# Patient Record
Sex: Male | Born: 1970 | Race: Black or African American | Hispanic: No | Marital: Single | Smoking: Never smoker
Health system: Southern US, Community
[De-identification: ages and names within clinical notes are randomized; demographics above are authoritative.]

## PROBLEM LIST (undated history)

## (undated) DIAGNOSIS — I10 Essential (primary) hypertension: Secondary | ICD-10-CM

## (undated) HISTORY — DX: Essential (primary) hypertension: I10

---

## 2004-10-06 ENCOUNTER — Emergency Department: Payer: Self-pay | Admitting: Unknown Physician Specialty

## 2010-04-03 HISTORY — PX: TONSILLECTOMY AND ADENOIDECTOMY: SHX28

## 2010-04-18 ENCOUNTER — Other Ambulatory Visit: Payer: Self-pay | Admitting: Family Medicine

## 2011-06-26 ENCOUNTER — Other Ambulatory Visit: Payer: Self-pay | Admitting: Family Medicine

## 2012-12-30 ENCOUNTER — Other Ambulatory Visit: Payer: Self-pay | Admitting: Family Medicine

## 2012-12-30 LAB — LIPID PANEL
Cholesterol: 158 mg/dL (ref 0–200)
HDL Cholesterol: 60 mg/dL (ref 40–60)
Ldl Cholesterol, Calc: 87 mg/dL (ref 0–100)
Triglycerides: 55 mg/dL (ref 0–200)
VLDL Cholesterol, Calc: 11 mg/dL (ref 5–40)

## 2012-12-30 LAB — TSH: Thyroid Stimulating Horm: 0.701 u[IU]/mL

## 2012-12-31 LAB — PSA: PSA: 0.8 ng/mL (ref 0.0–4.0)

## 2013-07-15 ENCOUNTER — Other Ambulatory Visit: Payer: Self-pay | Admitting: Family Medicine

## 2013-07-15 LAB — COMPREHENSIVE METABOLIC PANEL
Albumin: 3.8 g/dL (ref 3.4–5.0)
Alkaline Phosphatase: 95 U/L (ref 50–136)
Anion Gap: 2 — ABNORMAL LOW (ref 7–16)
BUN: 17 mg/dL (ref 7–18)
Bilirubin,Total: 0.3 mg/dL (ref 0.2–1.0)
Calcium, Total: 9.2 mg/dL (ref 8.5–10.1)
Chloride: 108 mmol/L — ABNORMAL HIGH (ref 98–107)
Co2: 30 mmol/L (ref 21–32)
Creatinine: 1 mg/dL (ref 0.60–1.30)
EGFR (African American): >60
EGFR (Non-African Amer.): >60
Glucose: 72 mg/dL (ref 65–99)
Osmolality: 279 (ref 275–301)
Potassium: 4.5 mmol/L (ref 3.5–5.1)
SGOT(AST): 25 U/L (ref 15–37)
SGPT (ALT): 25 U/L (ref 12–78)
Sodium: 140 mmol/L (ref 136–145)
Total Protein: 7.2 g/dL (ref 6.4–8.2)

## 2013-07-15 LAB — LIPID PANEL
Cholesterol: 148 mg/dL (ref 0–200)
HDL Cholesterol: 64 mg/dL — ABNORMAL HIGH (ref 40–60)
Ldl Cholesterol, Calc: 63 mg/dL (ref 0–100)
Triglycerides: 107 mg/dL (ref 0–200)
VLDL Cholesterol, Calc: 21 mg/dL (ref 5–40)

## 2013-07-15 LAB — CBC WITH DIFFERENTIAL/PLATELET
Basophil #: 0 10*3/uL (ref 0.0–0.1)
Basophil %: 0.7 %
Eosinophil #: 0.1 10*3/uL (ref 0.0–0.7)
Eosinophil %: 1.6 %
HCT: 42.9 % (ref 40.0–52.0)
HGB: 14.6 g/dL (ref 13.0–18.0)
Lymphocyte #: 0.8 10*3/uL — ABNORMAL LOW (ref 1.0–3.6)
Lymphocyte %: 13.5 %
MCH: 28.8 pg (ref 26.0–34.0)
MCHC: 34.1 g/dL (ref 32.0–36.0)
MCV: 84 fL (ref 80–100)
Monocyte #: 0.5 x10 3/mm (ref 0.2–1.0)
Monocyte %: 9 %
Neutrophil #: 4.4 10*3/uL (ref 1.4–6.5)
Neutrophil %: 75.2 %
Platelet: 221 10*3/uL (ref 150–440)
RBC: 5.08 10*6/uL (ref 4.40–5.90)
RDW: 14.5 % (ref 11.5–14.5)
WBC: 5.9 10*3/uL (ref 3.8–10.6)

## 2013-07-16 LAB — PSA: PSA: 1.2 ng/mL (ref 0.0–4.0)

## 2014-07-18 LAB — LIPID PANEL
Cholesterol: 162 mg/dL (ref 0–200)
HDL: 59 mg/dL (ref 35–70)
LDL Cholesterol: 91 mg/dL
Triglycerides: 59 mg/dL (ref 40–160)

## 2014-07-18 LAB — PSA: PSA: 1.1

## 2014-08-09 ENCOUNTER — Ambulatory Visit: Payer: Self-pay | Admitting: Family Medicine

## 2014-08-09 LAB — CBC WITH DIFFERENTIAL/PLATELET
Basophil #: 0.1 10*3/uL (ref 0.0–0.1)
Basophil %: 0.8 %
Eosinophil #: 0 10*3/uL (ref 0.0–0.7)
Eosinophil %: 0.7 %
HCT: 47.2 % (ref 40.0–52.0)
HGB: 15.3 g/dL (ref 13.0–18.0)
Lymphocyte #: 1.3 10*3/uL (ref 1.0–3.6)
Lymphocyte %: 18.4 %
MCH: 27.9 pg (ref 26.0–34.0)
MCHC: 32.4 g/dL (ref 32.0–36.0)
MCV: 86 fL (ref 80–100)
Monocyte #: 0.4 x10 3/mm (ref 0.2–1.0)
Monocyte %: 5.9 %
Neutrophil #: 5.1 10*3/uL (ref 1.4–6.5)
Neutrophil %: 74.2 %
Platelet: 243 10*3/uL (ref 150–440)
RBC: 5.47 10*6/uL (ref 4.40–5.90)
RDW: 14.2 % (ref 11.5–14.5)
WBC: 6.8 10*3/uL (ref 3.8–10.6)

## 2014-08-09 LAB — COMPREHENSIVE METABOLIC PANEL
Albumin: 4.1 g/dL (ref 3.4–5.0)
Alkaline Phosphatase: 89 U/L
Anion Gap: 8 (ref 7–16)
BUN: 20 mg/dL — ABNORMAL HIGH (ref 7–18)
Bilirubin,Total: 0.4 mg/dL (ref 0.2–1.0)
Calcium, Total: 8.9 mg/dL (ref 8.5–10.1)
Chloride: 105 mmol/L (ref 98–107)
Co2: 26 mmol/L (ref 21–32)
Creatinine: 1.05 mg/dL (ref 0.60–1.30)
EGFR (African American): >60
EGFR (Non-African Amer.): >60
Glucose: 87 mg/dL (ref 65–99)
Osmolality: 280 (ref 275–301)
Potassium: 3.6 mmol/L (ref 3.5–5.1)
SGOT(AST): 18 U/L (ref 15–37)
SGPT (ALT): 24 U/L
Sodium: 139 mmol/L (ref 136–145)
Total Protein: 7.5 g/dL (ref 6.4–8.2)

## 2014-08-09 LAB — LIPID PANEL
Cholesterol: 162 mg/dL (ref 0–200)
HDL Cholesterol: 59 mg/dL (ref 40–60)
Ldl Cholesterol, Calc: 91 mg/dL (ref 0–100)
Triglycerides: 59 mg/dL (ref 0–200)
VLDL Cholesterol, Calc: 12 mg/dL (ref 5–40)

## 2014-12-28 DIAGNOSIS — I10 Essential (primary) hypertension: Secondary | ICD-10-CM | POA: Insufficient documentation

## 2015-02-19 ENCOUNTER — Ambulatory Visit: Payer: Self-pay | Admitting: Family Medicine

## 2015-02-19 ENCOUNTER — Encounter: Payer: Self-pay | Admitting: Family Medicine

## 2015-02-19 DIAGNOSIS — E669 Obesity, unspecified: Secondary | ICD-10-CM | POA: Insufficient documentation

## 2015-02-19 DIAGNOSIS — F419 Anxiety disorder, unspecified: Secondary | ICD-10-CM | POA: Insufficient documentation

## 2015-02-19 DIAGNOSIS — G4733 Obstructive sleep apnea (adult) (pediatric): Secondary | ICD-10-CM | POA: Insufficient documentation

## 2015-02-19 DIAGNOSIS — J309 Allergic rhinitis, unspecified: Secondary | ICD-10-CM | POA: Insufficient documentation

## 2015-02-19 DIAGNOSIS — E739 Lactose intolerance, unspecified: Secondary | ICD-10-CM | POA: Insufficient documentation

## 2015-03-14 ENCOUNTER — Other Ambulatory Visit: Payer: Self-pay | Admitting: Family Medicine

## 2015-04-03 ENCOUNTER — Encounter: Payer: Self-pay | Admitting: Family Medicine

## 2015-04-03 ENCOUNTER — Ambulatory Visit (INDEPENDENT_AMBULATORY_CARE_PROVIDER_SITE_OTHER): Payer: BLUE CROSS/BLUE SHIELD | Admitting: Family Medicine

## 2015-04-03 ENCOUNTER — Other Ambulatory Visit
Admission: RE | Admit: 2015-04-03 | Discharge: 2015-04-03 | Disposition: A | Discharge status: Home / Self Care | Payer: BLUE CROSS/BLUE SHIELD | Source: Ambulatory Visit | Attending: Family Medicine | Admitting: Family Medicine

## 2015-04-03 VITALS — BP 124/68 | HR 65 | Temp 97.8°F | Resp 16 | Ht 65.0 in | Wt 175.1 lb

## 2015-04-03 DIAGNOSIS — J301 Allergic rhinitis due to pollen: Secondary | ICD-10-CM

## 2015-04-03 DIAGNOSIS — G4733 Obstructive sleep apnea (adult) (pediatric): Secondary | ICD-10-CM | POA: Diagnosis not present

## 2015-04-03 DIAGNOSIS — I10 Essential (primary) hypertension: Secondary | ICD-10-CM

## 2015-04-03 LAB — COMPREHENSIVE METABOLIC PANEL
ALT: 17 U/L (ref 17–63)
AST: 18 U/L (ref 15–41)
Albumin: 4.2 g/dL (ref 3.5–5.0)
Alkaline Phosphatase: 84 U/L (ref 38–126)
Anion gap: 7 (ref 5–15)
BUN: 15 mg/dL (ref 6–20)
CO2: 25 mmol/L (ref 22–32)
Calcium: 9.2 mg/dL (ref 8.9–10.3)
Chloride: 107 mmol/L (ref 101–111)
Creatinine, Ser: 0.92 mg/dL (ref 0.61–1.24)
GFR calc Af Amer: >60 mL/min (ref >60)
GFR calc non Af Amer: >60 mL/min (ref >60)
Glucose, Bld: 100 mg/dL — ABNORMAL HIGH (ref 65–99)
Potassium: 3.7 mmol/L (ref 3.5–5.1)
Sodium: 139 mmol/L (ref 135–145)
Total Bilirubin: 0.4 mg/dL (ref 0.3–1.2)
Total Protein: 7.2 g/dL (ref 6.5–8.1)

## 2015-04-03 MED ORDER — MOMETASONE FUROATE 50 MCG/ACT NA SUSP: 2.0000 | Freq: Every day | NASAL | Status: DC

## 2015-04-03 NOTE — Progress Notes (Signed)
Name: EBIN PALAZZI   MRN: [id number]    DOB: 1971/02/14   Date:04/03/2015       Progress Note  Subjective  Chief Complaint  Chief Complaint  Patient presents with  . Hypertension  . Gastrophageal Reflux    Hypertension This is a chronic problem. The current episode started more than 1 year ago. The problem is unchanged. The problem is controlled. Pertinent negatives include no blurred vision, chest pain, headaches, neck pain, orthopnea, palpitations or shortness of breath. There are no associated agents to hypertension. Risk factors for coronary artery disease include male gender and sedentary lifestyle. Past treatments include ACE inhibitors and diuretics. The current treatment provides moderate improvement. There are no compliance problems.   Gastrophageal Reflux He complains of heartburn. He reports no chest pain, no coughing, no nausea or no sore throat. This is a chronic problem. The current episode started more than 1 year ago. The problem occurs frequently. The problem has been unchanged. The symptoms are aggravated by caffeine, lying down and certain foods. Pertinent negatives include no weight loss. Risk factors include caffeine use and obesity. He has tried a PPI for the symptoms. The treatment provided mild relief. Past procedures include an EGD.   Allergic rhinitis,  Complaint of bilateral nasal congestion and drainage which is copious and often watery particularly at night. There is no redness or irritation of the eyes or throat or  ears and there is no cough. Nasal discharge is clear in color.   Past Medical History  Diagnosis Date  . Hypertension     History  Substance Use Topics  . Smoking status: Never Smoker   . Smokeless tobacco: Not on file  . Alcohol Use: No     Current outpatient prescriptions:  .  benzonatate (TESSALON) 100 MG capsule, , Disp: , Rfl: 0 .  lisinopril-hydrochlorothiazide (PRINZIDE,ZESTORETIC) 20-12.5 MG per tablet, Take 1 tablet by mouth  daily., Disp: , Rfl:  .  omeprazole (PRILOSEC) 20 MG capsule, TAKE ONE CAPSULE BY MOUTH EVERY DAY, Disp: 90 capsule, Rfl: 3  No Known Allergies  Review of Systems  Constitutional: Negative for fever, chills and weight loss.  HENT: Positive for congestion. Negative for hearing loss, sore throat and tinnitus.        Nocturnal  nasal drainage and sneezing and itching  Eyes: Negative for blurred vision, double vision and redness.  Respiratory: Negative for cough, hemoptysis and shortness of breath.   Cardiovascular: Negative for chest pain, palpitations, orthopnea, claudication and leg swelling.  Gastrointestinal: Positive for heartburn. Negative for nausea, vomiting, diarrhea, constipation and blood in stool.  Genitourinary: Negative for dysuria, urgency, frequency and hematuria.  Musculoskeletal: Negative for myalgias, back pain, joint pain, falls and neck pain.  Skin: Negative for itching.  Neurological: Negative for dizziness, tingling, tremors, focal weakness, seizures, loss of consciousness, weakness and headaches.  Endo/Heme/Allergies: Does not bruise/bleed easily.  Psychiatric/Behavioral: Negative for depression and substance abuse. The patient is not nervous/anxious and does not have insomnia.      Objective  Filed Vitals:   04/03/15 1427  BP: 124/68  Pulse: 65  Temp: 97.8 F (36.6 C)  Resp: 16  Height:  (1.651 m)  Weight: 175 lb 1 oz (79.408 kg)  SpO2: 97%     Physical Exam  Constitutional: He is oriented to person, place, and time and well-developed, well-nourished, and in no distress.  HENT:  Head: Normocephalic.  Bilateral nasal turbinate swelling and erythema  Eyes: EOM are normal. Pupils are equal,  round, and reactive to light.  Neck: Normal range of motion. Neck supple. No thyromegaly present.  Cardiovascular: Normal rate, regular rhythm and normal heart sounds.   No murmur heard. Pulmonary/Chest: Effort normal and breath sounds normal. No respiratory  distress. He has no wheezes.  Musculoskeletal: Normal range of motion. He exhibits no edema.  Lymphadenopathy:    He has no cervical adenopathy.  Neurological: He is alert and oriented to person, place, and time. No cranial nerve deficit. Gait normal. Coordination normal.  Skin: Skin is warm and dry. No rash noted.  Psychiatric: Affect and judgment normal.      Assessment & Plan  1. Allergic rhinitis due to pollen Flonase OTC  2. Obstructive apnea Uses CPAP  3. Essential hypertension Well-controlled - Comprehensive metabolic panel

## 2015-04-03 NOTE — Patient Instructions (Addendum)
Follow-up in 6 months Watch his bed clothing in hot water to reduce dust mites

## 2015-07-24 ENCOUNTER — Encounter: Payer: Self-pay | Admitting: Family Medicine

## 2015-08-06 ENCOUNTER — Encounter: Payer: Self-pay | Admitting: Family Medicine

## 2015-08-06 ENCOUNTER — Ambulatory Visit (INDEPENDENT_AMBULATORY_CARE_PROVIDER_SITE_OTHER): Payer: BLUE CROSS/BLUE SHIELD | Admitting: Family Medicine

## 2015-08-06 ENCOUNTER — Other Ambulatory Visit
Admission: RE | Admit: 2015-08-06 | Discharge: 2015-08-06 | Disposition: A | Discharge status: Home / Self Care | Payer: BLUE CROSS/BLUE SHIELD | Source: Ambulatory Visit | Attending: Family Medicine | Admitting: Family Medicine

## 2015-08-06 VITALS — BP 138/80 | HR 69 | Temp 98.9°F | Resp 18 | Ht 65.0 in | Wt 175.5 lb

## 2015-08-06 DIAGNOSIS — Z125 Encounter for screening for malignant neoplasm of prostate: Secondary | ICD-10-CM

## 2015-08-06 DIAGNOSIS — Z0000 Encounter for general adult medical examination without abnormal findings: Secondary | ICD-10-CM | POA: Diagnosis not present

## 2015-08-06 LAB — COMPREHENSIVE METABOLIC PANEL
ALT: 22 U/L (ref 17–63)
AST: 17 U/L (ref 15–41)
Albumin: 4.5 g/dL (ref 3.5–5.0)
Alkaline Phosphatase: 82 U/L (ref 38–126)
Anion gap: 5 (ref 5–15)
BUN: 16 mg/dL (ref 6–20)
CO2: 27 mmol/L (ref 22–32)
Calcium: 9.8 mg/dL (ref 8.9–10.3)
Chloride: 108 mmol/L (ref 101–111)
Creatinine, Ser: 0.96 mg/dL (ref 0.61–1.24)
GFR calc Af Amer: >60 mL/min (ref >60)
GFR calc non Af Amer: >60 mL/min (ref >60)
Glucose, Bld: 97 mg/dL (ref 65–99)
Potassium: 4.3 mmol/L (ref 3.5–5.1)
Sodium: 140 mmol/L (ref 135–145)
Total Bilirubin: 0.3 mg/dL (ref 0.3–1.2)
Total Protein: 7.6 g/dL (ref 6.5–8.1)

## 2015-08-06 LAB — TSH: TSH: 1.708 u[IU]/mL (ref 0.350–4.500)

## 2015-08-06 LAB — CBC WITH DIFFERENTIAL/PLATELET
Basophils Absolute: 0.1 10*3/uL (ref 0–0.1)
Basophils Relative: 1 %
Eosinophils Absolute: 0.1 10*3/uL (ref 0–0.7)
Eosinophils Relative: 1 %
HCT: 47.8 % (ref 40.0–52.0)
Hemoglobin: 15.7 g/dL (ref 13.0–18.0)
Lymphocytes Relative: 21 %
Lymphs Abs: 1.3 10*3/uL (ref 1.0–3.6)
MCH: 28 pg (ref 26.0–34.0)
MCHC: 32.8 g/dL (ref 32.0–36.0)
MCV: 85.5 fL (ref 80.0–100.0)
Monocytes Absolute: 0.4 10*3/uL (ref 0.2–1.0)
Monocytes Relative: 7 %
Neutro Abs: 4.6 10*3/uL (ref 1.4–6.5)
Neutrophils Relative %: 70 %
Platelets: 240 10*3/uL (ref 150–440)
RBC: 5.6 MIL/uL (ref 4.40–5.90)
RDW: 14.3 % (ref 11.5–14.5)
WBC: 6.5 10*3/uL (ref 3.8–10.6)

## 2015-08-06 LAB — LIPID PANEL
Cholesterol: 182 mg/dL (ref 0–200)
HDL: 58 mg/dL (ref >40)
LDL Cholesterol: 108 mg/dL — ABNORMAL HIGH (ref 0–99)
Total CHOL/HDL Ratio: 3.1 RATIO
Triglycerides: 78 mg/dL (ref <150)
VLDL: 16 mg/dL (ref 0–40)

## 2015-08-06 LAB — PSA: PSA: 1.23 ng/mL (ref 0.00–4.00)

## 2015-08-06 MED ORDER — LISINOPRIL-HYDROCHLOROTHIAZIDE 20-12.5 MG PO TABS: 1.0000 | ORAL_TABLET | Freq: Every day | ORAL | Status: DC

## 2015-08-06 NOTE — Progress Notes (Signed)
Name: Alan Mathews   MRN: [id number]    DOB: 1971-07-11   Date:08/06/2015       Progress Note  Subjective  Chief Complaint  Chief Complaint  Patient presents with  . Annual Exam    HPI  44 year old presents for comprehensive H&P at the age of 44. Baseline problems are stable  Past Medical History  Diagnosis Date  . Hypertension     Social History  Substance Use Topics  . Smoking status: Never Smoker   . Smokeless tobacco: Not on file  . Alcohol Use: No     Current outpatient prescriptions:  .  lisinopril-hydrochlorothiazide (PRINZIDE,ZESTORETIC) 20-12.5 MG per tablet, Take 1 tablet by mouth daily., Disp: , Rfl:  .  mometasone (NASONEX) 50 MCG/ACT nasal spray, Place 2 sprays into the nose daily., Disp: 17 g, Rfl: 12 .  omeprazole (PRILOSEC) 20 MG capsule, TAKE ONE CAPSULE BY MOUTH EVERY DAY, Disp: 90 capsule, Rfl: 3 .  benzonatate (TESSALON) 100 MG capsule, , Disp: , Rfl: 0  No Known Allergies  Review of Systems  Constitutional: Negative for fever, chills and weight loss.  HENT: Negative for congestion, hearing loss, sore throat and tinnitus.   Eyes: Negative for blurred vision, double vision and redness.  Respiratory: Negative for cough, hemoptysis and shortness of breath.   Cardiovascular: Negative for chest pain, palpitations, orthopnea, claudication and leg swelling.  Gastrointestinal: Negative for heartburn, nausea, vomiting, diarrhea, constipation and blood in stool.  Genitourinary: Negative for dysuria, urgency, frequency and hematuria.  Musculoskeletal: Negative for myalgias, back pain, joint pain, falls and neck pain.  Skin: Negative for itching.  Neurological: Negative for dizziness, tingling, tremors, focal weakness, seizures, loss of consciousness, weakness and headaches.  Endo/Heme/Allergies: Does not bruise/bleed easily.  Psychiatric/Behavioral: Negative for depression and substance abuse. The patient is not nervous/anxious and does not have insomnia.       Objective  Filed Vitals:   08/06/15 1134  BP: 138/80  Pulse: 69  Temp: 98.9 F (37.2 C)  Resp: 18  Height: 5\' 5"  (1.651 m)  Weight: 175 lb 8 oz (79.606 kg)  SpO2: 98%     Physical Exam  Constitutional: He is oriented to person, place, and time and well-developed, well-nourished, and in no distress.  HENT:  Head: Normocephalic.  Eyes: EOM are normal. Pupils are equal, round, and reactive to light.  Neck: Normal range of motion. Neck supple. No thyromegaly present.  Cardiovascular: Normal rate, regular rhythm and normal heart sounds.   No murmur heard. Pulmonary/Chest: Effort normal and breath sounds normal. No respiratory distress. He has no wheezes.  Abdominal: Soft. Bowel sounds are normal.  Genitourinary: Rectum normal, prostate normal and penis normal. Guaiac negative stool. No discharge found.  Musculoskeletal: Normal range of motion. He exhibits no edema.  Lymphadenopathy:    He has no cervical adenopathy.  Neurological: He is alert and oriented to person, place, and time. No cranial nerve deficit. Gait normal. Coordination normal.  Skin: Skin is warm and dry. No rash noted.  Psychiatric: Affect and judgment normal.      Assessment & Plan  1. Annual physical exam  - CBC - Comprehensive Metabolic Panel (CMET) - Lipid Profile - TSH  2. Prostate cancer screening  - PSA

## 2015-10-04 ENCOUNTER — Ambulatory Visit: Payer: BLUE CROSS/BLUE SHIELD | Admitting: Family Medicine

## 2015-12-05 ENCOUNTER — Ambulatory Visit: Payer: BLUE CROSS/BLUE SHIELD | Admitting: Family Medicine

## 2015-12-11 ENCOUNTER — Encounter: Payer: Self-pay | Admitting: Family Medicine

## 2015-12-11 ENCOUNTER — Ambulatory Visit (INDEPENDENT_AMBULATORY_CARE_PROVIDER_SITE_OTHER): Payer: BLUE CROSS/BLUE SHIELD | Admitting: Family Medicine

## 2015-12-11 VITALS — BP 137/76 | HR 78 | Temp 98.4°F | Resp 16 | Ht 65.0 in | Wt 171.3 lb

## 2015-12-11 DIAGNOSIS — R058 Other specified cough: Secondary | ICD-10-CM | POA: Insufficient documentation

## 2015-12-11 DIAGNOSIS — R05 Cough: Secondary | ICD-10-CM

## 2015-12-11 MED ORDER — BENZONATATE 200 MG PO CAPS: 200.0000 mg | ORAL_CAPSULE | Freq: Two times a day (BID) | ORAL | Status: DC | PRN

## 2015-12-11 MED ORDER — AZITHROMYCIN 250 MG PO TABS: ORAL_TABLET | ORAL | Status: DC

## 2015-12-11 NOTE — Progress Notes (Signed)
Name: Alan Mathews   MRN: [id number]    DOB: April 23, 1971   Date:12/11/2015       Progress Note  Subjective  Chief Complaint  Chief Complaint  Patient presents with  . Acute Visit    Congestion/cough and runny nose (Dr. Thana Ates pt) x1 wk he has been taking nyquil for symptoms last time taken was saturday night  12/08/2015    HPI  Cough and Congestion: Symptoms onset 7 days ago, started with dry cough, runny nose, then started having body aches. He has taken Nyquil for symptoms, which has helped to some extent.  Past Medical History  Diagnosis Date  . Hypertension     Past Surgical History  Procedure Laterality Date  . Tonsillectomy and adenoidectomy  04/03/2010    Family History  Problem Relation Age of Onset  . Hypertension Mother   . Hypertension Father     Social History   Social History  . Marital Status: Single    Spouse Name: N/A  . Number of Children: N/A  . Years of Education: N/A   Occupational History  . Not on file.   Social History Main Topics  . Smoking status: Never Smoker   . Smokeless tobacco: Not on file  . Alcohol Use: No  . Drug Use: No  . Sexual Activity: Not on file   Other Topics Concern  . Not on file   Social History Narrative     Current outpatient prescriptions:  .  lisinopril-hydrochlorothiazide (PRINZIDE,ZESTORETIC) 20-12.5 MG tablet, Take 1 tablet by mouth daily., Disp: 90 tablet, Rfl: 1 .  mometasone (NASONEX) 50 MCG/ACT nasal spray, Place 2 sprays into the nose daily., Disp: 17 g, Rfl: 12 .  omeprazole (PRILOSEC) 20 MG capsule, TAKE ONE CAPSULE BY MOUTH EVERY DAY, Disp: 90 capsule, Rfl: 3  No Known Allergies   Review of Systems  Constitutional: Negative for fever and chills (last week had chills with onset of symptoms.).  HENT: Negative for sore throat.   Respiratory: Positive for cough and sputum production. Negative for shortness of breath and wheezing.     Objective  Filed Vitals:   12/11/15 1002  BP: 137/76   Pulse: 78  Temp: 98.4 F (36.9 C)  TempSrc: Oral  Resp: 16  Height:  (1.651 m)  Weight: 171 lb 4.8 oz (77.701 kg)  SpO2: 97%    Physical Exam  Constitutional: He is oriented to person, place, and time and well-developed, well-nourished, and in no distress.  HENT:  Head: Normocephalic and atraumatic.  Nose: Right sinus exhibits no maxillary sinus tenderness and no frontal sinus tenderness. Left sinus exhibits no maxillary sinus tenderness and no frontal sinus tenderness.  Mouth/Throat: Posterior oropharyngeal erythema present. No posterior oropharyngeal edema.  R. Ear cerumen impaction. L. Ear normal canal.  Cardiovascular: Normal rate and regular rhythm.   Pulmonary/Chest: Effort normal and breath sounds normal.  Neurological: He is alert and oriented to person, place, and time.  Nursing note and vitals reviewed.      Assessment & Plan  1. Productive cough Symptoms are resolving, started on Tessalon for relief of coughing. May start taking antibiotic therapy if symptoms do not completely resolve within the next 48 hours. - benzonatate (TESSALON) 200 MG capsule; Take 1 capsule (200 mg total) by mouth 2 (two) times daily as needed for cough.  Dispense: 20 capsule; Refill: 0 - azithromycin (ZITHROMAX Z-PAK) 250 MG tablet; 2 tabs po day 1, then 1 tab po q day x 4 days  Dispense: 6 each; Refill: 0   Lanisha Stepanian Asad A. Faylene KurtzShah [facility] 12/11/2015 10:24 AM

## 2016-01-09 ENCOUNTER — Ambulatory Visit: Payer: BLUE CROSS/BLUE SHIELD | Admitting: Family Medicine

## 2016-04-30 ENCOUNTER — Ambulatory Visit: Payer: BLUE CROSS/BLUE SHIELD | Admitting: Family Medicine

## 2016-05-07 DIAGNOSIS — H16143 Punctate keratitis, bilateral: Secondary | ICD-10-CM | POA: Diagnosis not present

## 2016-05-07 DIAGNOSIS — H04123 Dry eye syndrome of bilateral lacrimal glands: Secondary | ICD-10-CM | POA: Diagnosis not present

## 2016-05-27 ENCOUNTER — Other Ambulatory Visit: Payer: Self-pay | Admitting: Family Medicine

## 2016-06-20 ENCOUNTER — Telehealth: Payer: Self-pay | Admitting: Family Medicine

## 2016-06-20 NOTE — Telephone Encounter (Signed)
Requesting refill on omeprazole. Please send to [facility]. Patient is completely out

## 2016-06-20 NOTE — Telephone Encounter (Signed)
Please schedule patient for an appointment for medication refills. 

## 2016-06-20 NOTE — Telephone Encounter (Signed)
Left voice message for patient to schedule appointment. °

## 2016-06-30 ENCOUNTER — Encounter: Payer: Self-pay | Admitting: Family Medicine

## 2016-06-30 ENCOUNTER — Ambulatory Visit (INDEPENDENT_AMBULATORY_CARE_PROVIDER_SITE_OTHER): Payer: BLUE CROSS/BLUE SHIELD | Admitting: Family Medicine

## 2016-06-30 VITALS — BP 140/80 | HR 72 | Temp 98.5°F | Resp 16 | Ht 66.0 in | Wt 172.0 lb

## 2016-06-30 DIAGNOSIS — K219 Gastro-esophageal reflux disease without esophagitis: Secondary | ICD-10-CM | POA: Insufficient documentation

## 2016-06-30 DIAGNOSIS — I10 Essential (primary) hypertension: Secondary | ICD-10-CM

## 2016-06-30 MED ORDER — OMEPRAZOLE 20 MG PO CPDR: 20.0000 mg | DELAYED_RELEASE_CAPSULE | Freq: Every day | ORAL | 1 refills | Status: DC

## 2016-06-30 NOTE — Progress Notes (Signed)
Name: Alan HeraldKevin J Mathews   MRN: [id number]    DOB: 02-Aug-1971   Date:06/30/2016       Progress Note  Subjective  Chief Complaint  Chief Complaint  Patient presents with  . Hypertension  . Gastroesophageal Reflux    medication refilla    Hypertension  This is a chronic problem. The problem is unchanged. The problem is controlled. Pertinent negatives include no blurred vision, chest pain, headaches, palpitations or shortness of breath. Past treatments include ACE inhibitors and diuretics. There is no history of kidney disease, CAD/MI or CVA.  Gastroesophageal Reflux  He complains of abdominal pain, belching and heartburn. He reports no chest pain, no choking, no coughing or no dysphagia. This is a chronic problem. The problem has been unchanged. The heartburn is located in the abdomen. The symptoms are aggravated by certain foods (bananas, coffee, sodas). He has tried a PPI for the symptoms. Past procedures do not include an EGD or esophageal manometry.     Past Medical History:  Diagnosis Date  . Hypertension     Past Surgical History:  Procedure Laterality Date  . TONSILLECTOMY AND ADENOIDECTOMY  04/03/2010    Family History  Problem Relation Age of Onset  . Hypertension Mother   . Hypertension Father     Social History   Social History  . Marital status: Single    Spouse name: N/A  . Number of children: N/A  . Years of education: N/A   Occupational History  . Not on file.   Social History Main Topics  . Smoking status: Never Smoker  . Smokeless tobacco: Not on file  . Alcohol use No  . Drug use: No  . Sexual activity: Not on file   Other Topics Concern  . Not on file   Social History Narrative  . No narrative on file     Current Outpatient Prescriptions:  .  lisinopril-hydrochlorothiazide (PRINZIDE,ZESTORETIC) 20-12.5 MG tablet, Take 1 tablet by mouth daily., Disp: 90 tablet, Rfl: 1 .  omeprazole (PRILOSEC) 20 MG capsule, TAKE ONE CAPSULE BY MOUTH EVERY  DAY, Disp: 90 capsule, Rfl: 3  No Known Allergies   Review of Systems  Eyes: Negative for blurred vision.  Respiratory: Negative for cough, choking and shortness of breath.   Cardiovascular: Negative for chest pain and palpitations.  Gastrointestinal: Positive for abdominal pain and heartburn. Negative for dysphagia.  Neurological: Negative for headaches.    Objective  Vitals:   06/30/16 1028  BP: 140/80  Pulse: 72  Resp: 16  Temp: 98.5 F (36.9 C)  TempSrc: Oral  SpO2: 98%  Weight: 172 lb (78 kg)  Height: 5\' 6"  (1.676 m)    Physical Exam  Constitutional: He is oriented to person, place, and time and well-developed, well-nourished, and in no distress.  HENT:  Head: Normocephalic.  Cardiovascular: Normal rate, regular rhythm and normal heart sounds.   No murmur heard. Pulmonary/Chest: Effort normal. He has wheezes.  Abdominal: Soft. Bowel sounds are normal. There is no tenderness.  Musculoskeletal: He exhibits no edema.  Neurological: He is alert and oriented to person, place, and time.  Skin: Skin is warm and dry.  Psychiatric: Mood, memory, affect and judgment normal.  Nursing note and vitals reviewed.   Assessment & Plan  1. Essential hypertension BP stable and controlled on present antihypertensive therapy  2. Gastroesophageal reflux disease, esophagitis presence not specified Stable and responsive to PPI.  - omeprazole (PRILOSEC) 20 MG capsule; Take 1 capsule (20 mg total) by mouth daily.  Dispense: 90 capsule; Refill: 1   Maudene Stotler Asad A. Faylene Kurtz [facility] 06/30/2016 10:34 AM

## 2016-07-28 ENCOUNTER — Other Ambulatory Visit: Payer: Self-pay | Admitting: Family Medicine

## 2016-08-08 ENCOUNTER — Encounter: Payer: Self-pay | Admitting: Family Medicine

## 2016-08-08 ENCOUNTER — Ambulatory Visit (INDEPENDENT_AMBULATORY_CARE_PROVIDER_SITE_OTHER): Payer: BLUE CROSS/BLUE SHIELD | Admitting: Family Medicine

## 2016-08-08 DIAGNOSIS — Z0000 Encounter for general adult medical examination without abnormal findings: Secondary | ICD-10-CM | POA: Diagnosis not present

## 2016-08-08 LAB — CBC WITH DIFFERENTIAL/PLATELET
Basophils Absolute: 0 cells/uL (ref 0–200)
Basophils Relative: 0 %
Eosinophils Absolute: 106 cells/uL (ref 15–500)
Eosinophils Relative: 2 %
HCT: 48.2 % (ref 38.5–50.0)
Hemoglobin: 15.9 g/dL (ref 13.2–17.1)
Lymphocytes Relative: 28 %
Lymphs Abs: 1484 cells/uL (ref 850–3900)
MCH: 28.4 pg (ref 27.0–33.0)
MCHC: 33 g/dL (ref 32.0–36.0)
MCV: 86.1 fL (ref 80.0–100.0)
MPV: 10.4 fL (ref 7.5–12.5)
Monocytes Absolute: 424 cells/uL (ref 200–950)
Monocytes Relative: 8 %
Neutro Abs: 3286 cells/uL (ref 1500–7800)
Neutrophils Relative %: 62 %
Platelets: 239 10*3/uL (ref 140–400)
RBC: 5.6 MIL/uL (ref 4.20–5.80)
RDW: 14.6 % (ref 11.0–15.0)
WBC: 5.3 10*3/uL (ref 3.8–10.8)

## 2016-08-08 LAB — TSH: TSH: 0.84 mIU/L (ref 0.40–4.50)

## 2016-08-08 NOTE — Progress Notes (Signed)
Name: Alan Mathews   MRN: [id number]    DOB: June 05, 1971   Date:08/08/2016       Progress Note  Subjective  Chief Complaint  Chief Complaint  Patient presents with  . Annual Exam    CPE    HPI  Pt. Presents for annual physical exam. He is doing well.   Past Medical History:  Diagnosis Date  . Hypertension     Past Surgical History:  Procedure Laterality Date  . TONSILLECTOMY AND ADENOIDECTOMY  04/03/2010    Family History  Problem Relation Age of Onset  . Hypertension Mother   . Hypertension Father     Social History   Social History  . Marital status: Single    Spouse name: N/A  . Number of children: N/A  . Years of education: N/A   Occupational History  . Not on file.   Social History Main Topics  . Smoking status: Never Smoker  . Smokeless tobacco: Never Used  . Alcohol use No  . Drug use: No  . Sexual activity: Not on file   Other Topics Concern  . Not on file   Social History Narrative  . No narrative on file     Current Outpatient Prescriptions:  .  lisinopril-hydrochlorothiazide (PRINZIDE,ZESTORETIC) 20-12.5 MG tablet, Take 1 tablet by mouth daily., Disp: 90 tablet, Rfl: 1 .  omeprazole (PRILOSEC) 20 MG capsule, Take 1 capsule (20 mg total) by mouth daily., Disp: 90 capsule, Rfl: 1  No Known Allergies   Review of Systems  Constitutional: Negative for chills, fever and malaise/fatigue.  HENT: Negative for sinus pain and sore throat.   Respiratory: Negative for cough, sputum production and shortness of breath.   Cardiovascular: Negative for chest pain, palpitations and leg swelling.  Gastrointestinal: Negative for abdominal pain, blood in stool, nausea and vomiting.  Genitourinary: Negative for dysuria and hematuria.  Musculoskeletal: Negative for back pain, joint pain, myalgias and neck pain.  Neurological: Negative for dizziness and headaches.  Psychiatric/Behavioral: Negative for depression. The patient is not nervous/anxious and does  not have insomnia.      Objective  Vitals:   08/08/16 1056  BP: 132/73  Pulse: 69  Resp: 16  Temp: 99 F (37.2 C)  TempSrc: Oral  SpO2: 98%  Weight: 170 lb 8 oz (77.3 kg)  Height: 5\' 6"  (1.676 m)    Physical Exam  Constitutional: He is oriented to person, place, and time and well-developed, well-nourished, and in no distress.  HENT:  Head: Normocephalic and atraumatic.  Mouth/Throat: No posterior oropharyngeal erythema.  Eyes: Pupils are equal, round, and reactive to light.  Cardiovascular: Normal rate, regular rhythm and normal heart sounds.   No murmur heard. Pulmonary/Chest: Effort normal and breath sounds normal. He has no wheezes.  Abdominal: Soft. Bowel sounds are normal.  Genitourinary: Rectum normal and prostate normal.  Musculoskeletal:       Right ankle: He exhibits no swelling.       Left ankle: He exhibits no swelling.  Neurological: He is alert and oriented to person, place, and time.  Skin: Skin is warm and dry.  Psychiatric: Mood, memory, affect and judgment normal.  Nursing note and vitals reviewed.     Assessment & Plan  1. Annual physical exam We'll obtain age appropriate laboratory workup - CBC with Differential - COMPLETE METABOLIC PANEL WITH GFR - Lipid Profile - TSH - Vitamin D (25 hydroxy) - PSA   Alan Mathews Asad A. Faylene KurtzShah [facility]  08/08/2016 11:14 AM

## 2016-08-09 LAB — COMPLETE METABOLIC PANEL WITH GFR
ALT: 16 U/L (ref 9–46)
AST: 15 U/L (ref 10–40)
Albumin: 4.2 g/dL (ref 3.6–5.1)
Alkaline Phosphatase: 74 U/L (ref 40–115)
BUN: 18 mg/dL (ref 7–25)
CO2: 24 mmol/L (ref 20–31)
Calcium: 9.3 mg/dL (ref 8.6–10.3)
Chloride: 107 mmol/L (ref 98–110)
Creat: 1.13 mg/dL (ref 0.60–1.35)
GFR, Est African American: >89 mL/min (ref >=60)
GFR, Est Non African American: 78 mL/min (ref >=60)
Glucose, Bld: 84 mg/dL (ref 65–99)
Potassium: 4.2 mmol/L (ref 3.5–5.3)
Sodium: 142 mmol/L (ref 135–146)
Total Bilirubin: 0.6 mg/dL (ref 0.2–1.2)
Total Protein: 6.7 g/dL (ref 6.1–8.1)

## 2016-08-09 LAB — LIPID PANEL
Cholesterol: 181 mg/dL (ref <200)
HDL: 58 mg/dL (ref >40)
LDL Cholesterol: 111 mg/dL — ABNORMAL HIGH (ref <100)
Total CHOL/HDL Ratio: 3.1 Ratio (ref <5.0)
Triglycerides: 61 mg/dL (ref <150)
VLDL: 12 mg/dL (ref <30)

## 2016-08-09 LAB — PSA: PSA: 1 ng/mL (ref <=4.0)

## 2016-08-09 LAB — VITAMIN D 25 HYDROXY (VIT D DEFICIENCY, FRACTURES): Vit D, 25-Hydroxy: 15 ng/mL — ABNORMAL LOW (ref 30–100)

## 2016-08-12 ENCOUNTER — Telehealth: Payer: Self-pay

## 2016-08-12 MED ORDER — VITAMIN D (ERGOCALCIFEROL) 1.25 MG (50000 UNIT) PO CAPS: 50000.0000 [IU] | ORAL_CAPSULE | ORAL | 0 refills | Status: DC

## 2016-08-12 NOTE — Telephone Encounter (Signed)
Patient has been notified of lab results and a prescription of vitamin D3 50,000 units take 1 capsule once a week x12 weeks has been sent to [facility] per Dr. Sherryll BurgerShah, patient has been notified

## 2016-11-01 ENCOUNTER — Other Ambulatory Visit: Payer: Self-pay | Admitting: Family Medicine

## 2016-11-16 ENCOUNTER — Other Ambulatory Visit: Payer: Self-pay | Admitting: Family Medicine

## 2016-12-31 ENCOUNTER — Other Ambulatory Visit: Payer: Self-pay | Admitting: Family Medicine

## 2016-12-31 DIAGNOSIS — K219 Gastro-esophageal reflux disease without esophagitis: Secondary | ICD-10-CM

## 2017-01-09 DIAGNOSIS — H04123 Dry eye syndrome of bilateral lacrimal glands: Secondary | ICD-10-CM | POA: Diagnosis not present

## 2017-06-08 ENCOUNTER — Ambulatory Visit (INDEPENDENT_AMBULATORY_CARE_PROVIDER_SITE_OTHER): Payer: BLUE CROSS/BLUE SHIELD | Admitting: Family Medicine

## 2017-06-08 ENCOUNTER — Encounter: Payer: Self-pay | Admitting: Family Medicine

## 2017-06-08 VITALS — BP 130/80 | HR 64 | Temp 98.5°F | Resp 18 | Ht 66.0 in | Wt 171.4 lb

## 2017-06-08 DIAGNOSIS — M62838 Other muscle spasm: Secondary | ICD-10-CM

## 2017-06-08 DIAGNOSIS — R630 Anorexia: Secondary | ICD-10-CM | POA: Diagnosis not present

## 2017-06-08 DIAGNOSIS — R5383 Other fatigue: Secondary | ICD-10-CM | POA: Diagnosis not present

## 2017-06-08 DIAGNOSIS — I10 Essential (primary) hypertension: Secondary | ICD-10-CM | POA: Diagnosis not present

## 2017-06-08 DIAGNOSIS — B354 Tinea corporis: Secondary | ICD-10-CM

## 2017-06-08 DIAGNOSIS — R634 Abnormal weight loss: Secondary | ICD-10-CM

## 2017-06-08 DIAGNOSIS — J301 Allergic rhinitis due to pollen: Secondary | ICD-10-CM | POA: Diagnosis not present

## 2017-06-08 MED ORDER — BACLOFEN 10 MG PO TABS: 5.0000 mg | ORAL_TABLET | Freq: Every evening | ORAL | 0 refills | Status: DC | PRN

## 2017-06-08 MED ORDER — KETOCONAZOLE 2 % EX CREA: 1.0000 "application " | TOPICAL_CREAM | Freq: Every day | CUTANEOUS | 0 refills | Status: DC

## 2017-06-08 MED ORDER — TRIAMCINOLONE ACETONIDE 0.025 % EX CREA: 1.0000 "application " | TOPICAL_CREAM | Freq: Two times a day (BID) | CUTANEOUS | 0 refills | Status: DC

## 2017-06-08 MED ORDER — LEVOCETIRIZINE DIHYDROCHLORIDE 5 MG PO TABS: 5.0000 mg | ORAL_TABLET | Freq: Every evening | ORAL | 1 refills | Status: DC

## 2017-06-08 NOTE — Patient Instructions (Addendum)
Back Exercises If you have pain in your back, do these exercises 2-3 times each day or as told by your doctor. When the pain goes away, do the exercises once each day, but repeat the steps more times for each exercise (do more repetitions). If you do not have pain in your back, do these exercises once each day or as told by your doctor. Exercises Single Knee to Chest  Do these steps 3-5 times in a row for each leg: 1. Lie on your back on a firm bed or the floor with your legs stretched out. 2. Bring one knee to your chest. 3. Hold your knee to your chest by grabbing your knee or thigh. 4. Pull on your knee until you feel a gentle stretch in your lower back. 5. Keep doing the stretch for 10-30 seconds. 6. Slowly let go of your leg and straighten it.  Pelvic Tilt  Do these steps 5-10 times in a row: 1. Lie on your back on a firm bed or the floor with your legs stretched out. 2. Bend your knees so they point up to the ceiling. Your feet should be flat on the floor. 3. Tighten your lower belly (abdomen) muscles to press your lower back against the floor. This will make your tailbone point up to the ceiling instead of pointing down to your feet or the floor. 4. Stay in this position for 5-10 seconds while you gently tighten your muscles and breathe evenly.  Cat-Cow  Do these steps until your lower back bends more easily: 1. Get on your hands and knees on a firm surface. Keep your hands under your shoulders, and keep your knees under your hips. You may put padding under your knees. 2. Let your head hang down, and make your tailbone point down to the floor so your lower back is round like the back of a cat. 3. Stay in this position for 5 seconds. 4. Slowly lift your head and make your tailbone point up to the ceiling so your back hangs low (sags) like the back of a cow. 5. Stay in this position for 5 seconds.  Press-Ups  Do these steps 5-10 times in a row: 1. Lie on your belly (face-down)  on the floor. 2. Place your hands near your head, about shoulder-width apart. 3. While you keep your back relaxed and keep your hips on the floor, slowly straighten your arms to raise the top half of your body and lift your shoulders. Do not use your back muscles. To make yourself more comfortable, you may change where you place your hands. 4. Stay in this position for 5 seconds. 5. Slowly return to lying flat on the floor.  Bridges  Do these steps 10 times in a row: 1. Lie on your back on a firm surface. 2. Bend your knees so they point up to the ceiling. Your feet should be flat on the floor. 3. Tighten your butt muscles and lift your butt off of the floor until your waist is almost as high as your knees. If you do not feel the muscles working in your butt and the back of your thighs, slide your feet 1-2 inches farther away from your butt. 4. Stay in this position for 3-5 seconds. 5. Slowly lower your butt to the floor, and let your butt muscles relax.  If this exercise is too easy, try doing it with your arms crossed over your chest. Belly Crunches  Do these steps 5-10 times in   a row: 1. Lie on your back on a firm bed or the floor with your legs stretched out. 2. Bend your knees so they point up to the ceiling. Your feet should be flat on the floor. 3. Cross your arms over your chest. 4. Tip your chin a little bit toward your chest but do not bend your neck. 5. Tighten your belly muscles and slowly raise your chest just enough to lift your shoulder blades a tiny bit off of the floor. 6. Slowly lower your chest and your head to the floor.  Back Lifts Do these steps 5-10 times in a row: 1. Lie on your belly (face-down) with your arms at your sides, and rest your forehead on the floor. 2. Tighten the muscles in your legs and your butt. 3. Slowly lift your chest off of the floor while you keep your hips on the floor. Keep the back of your head in line with the curve in your back. Look at  the floor while you do this. 4. Stay in this position for 3-5 seconds. 5. Slowly lower your chest and your face to the floor.  Contact a doctor if:  Your back pain gets a lot worse when you do an exercise.  Your back pain does not lessen 2 hours after you exercise. If you have any of these problems, stop doing the exercises. Do not do them again unless your doctor says it is okay. Get help right away if:  You have sudden, very bad back pain. If this happens, stop doing the exercises. Do not do them again unless your doctor says it is okay. This information is not intended to replace advice given to you by your health care provider. Make sure you discuss any questions you have with your health care provider. Document Released: 09/27/2010 Document Revised: 01/31/2016 Document Reviewed: 10/19/2014 Elsevier Interactive Patient Education  2018 [employer]  DASH Eating Plan DASH stands for "Dietary Approaches to Stop Hypertension." The DASH eating plan is a healthy eating plan that has been shown to reduce high blood pressure (hypertension). It may also reduce your risk for type 2 diabetes, heart disease, and stroke. The DASH eating plan may also help with weight loss. What are tips for following this plan? General guidelines  Avoid eating more than 2,300 mg (milligrams) of salt (sodium) a day. If you have hypertension, you may need to reduce your sodium intake to 1,500 mg a day.  Limit alcohol intake to no more than 1 drink a day for nonpregnant women and 2 drinks a day for men. One drink equals 12 oz of beer, 5 oz of wine, or 1 oz of hard liquor.  Work with your health care provider to maintain a healthy body weight or to lose weight. Ask what an ideal weight is for you.  Get at least 30 minutes of exercise that causes your heart to beat faster (aerobic exercise) most days of the week. Activities may include walking, swimming, or biking.  Work with your health care provider or diet and  nutrition specialist (dietitian) to adjust your eating plan to your individual calorie needs. Reading food labels  Check food labels for the amount of sodium per serving. Choose foods with less than 5 percent of the Daily Value of sodium. Generally, foods with less than 300 mg of sodium per serving fit into this eating plan.  To find whole grains, look for the word "whole" as the first word in the ingredient list. Shopping    Buy products labeled as "low-sodium" or "no salt added."  Buy fresh foods. Avoid canned foods and premade or frozen meals. Cooking  Avoid adding salt when cooking. Use salt-free seasonings or herbs instead of table salt or sea salt. Check with your health care provider or pharmacist before using salt substitutes.  Do not fry foods. Cook foods using healthy methods such as baking, boiling, grilling, and broiling instead.  Cook with heart-healthy oils, such as olive, canola, soybean, or sunflower oil. Meal planning   Eat a balanced diet that includes: ? 5 or more servings of fruits and vegetables each day. At each meal, try to fill half of your plate with fruits and vegetables. ? Up to 6-8 servings of whole grains each day. ? Less than 6 oz of lean meat, poultry, or fish each day. A 3-oz serving of meat is about the same size as a deck of cards. One egg equals 1 oz. ? 2 servings of low-fat dairy each day. ? A serving of nuts, seeds, or beans 5 times each week. ? Heart-healthy fats. Healthy fats called Omega-3 fatty acids are found in foods such as flaxseeds and coldwater fish, like sardines, salmon, and mackerel.  Limit how much you eat of the following: ? Canned or prepackaged foods. ? Food that is high in trans fat, such as fried foods. ? Food that is high in saturated fat, such as fatty meat. ? Sweets, desserts, sugary drinks, and other foods with added sugar. ? Full-fat dairy products.  Do not salt foods before eating.  Try to eat at least 2 vegetarian  meals each week.  Eat more home-cooked food and less restaurant, buffet, and fast food.  When eating at a restaurant, ask that your food be prepared with less salt or no salt, if possible. What foods are recommended? The items listed may not be a complete list. Talk with your dietitian about what dietary choices are best for you. Grains Whole-grain or whole-wheat bread. Whole-grain or whole-wheat pasta. Brown rice. Oatmeal. Quinoa. Bulgur. Whole-grain and low-sodium cereals. Pita bread. Low-fat, low-sodium crackers. Whole-wheat flour tortillas. Vegetables Fresh or frozen vegetables (raw, steamed, roasted, or grilled). Low-sodium or reduced-sodium tomato and vegetable juice. Low-sodium or reduced-sodium tomato sauce and tomato paste. Low-sodium or reduced-sodium canned vegetables. Fruits All fresh, dried, or frozen fruit. Canned fruit in natural juice (without added sugar). Meat and other protein foods Skinless chicken or turkey. Ground chicken or turkey. Pork with fat trimmed off. Fish and seafood. Egg whites. Dried beans, peas, or lentils. Unsalted nuts, nut butters, and seeds. Unsalted canned beans. Lean cuts of beef with fat trimmed off. Low-sodium, lean deli meat. Dairy Low-fat (1%) or fat-free (skim) milk. Fat-free, low-fat, or reduced-fat cheeses. Nonfat, low-sodium ricotta or cottage cheese. Low-fat or nonfat yogurt. Low-fat, low-sodium cheese. Fats and oils Soft margarine without trans fats. Vegetable oil. Low-fat, reduced-fat, or light mayonnaise and salad dressings (reduced-sodium). Canola, safflower, olive, soybean, and sunflower oils. Avocado. Seasoning and other foods Herbs. Spices. Seasoning mixes without salt. Unsalted popcorn and pretzels. Fat-free sweets. What foods are not recommended? The items listed may not be a complete list. Talk with your dietitian about what dietary choices are best for you. Grains Baked goods made with fat, such as croissants, muffins, or some  breads. Dry pasta or rice meal packs. Vegetables Creamed or fried vegetables. Vegetables in a cheese sauce. Regular canned vegetables (not low-sodium or reduced-sodium). Regular canned tomato sauce and paste (not low-sodium or reduced-sodium). Regular tomato and vegetable juice (  not low-sodium or reduced-sodium). Pickles. Olives. Fruits Canned fruit in a light or heavy syrup. Fried fruit. Fruit in cream or butter sauce. Meat and other protein foods Fatty cuts of meat. Ribs. Fried meat. Bacon. Sausage. Bologna and other processed lunch meats. Salami. Fatback. Hotdogs. Bratwurst. Salted nuts and seeds. Canned beans with added salt. Canned or smoked fish. Whole eggs or egg yolks. Chicken or turkey with skin. Dairy Whole or 2% milk, cream, and half-and-half. Whole or full-fat cream cheese. Whole-fat or sweetened yogurt. Full-fat cheese. Nondairy creamers. Whipped toppings. Processed cheese and cheese spreads. Fats and oils Butter. Stick margarine. Lard. Shortening. Ghee. Bacon fat. Tropical oils, such as coconut, palm kernel, or palm oil. Seasoning and other foods Salted popcorn and pretzels. Onion salt, garlic salt, seasoned salt, table salt, and sea salt. Worcestershire sauce. Tartar sauce. Barbecue sauce. Teriyaki sauce. Soy sauce, including reduced-sodium. Steak sauce. Canned and packaged gravies. Fish sauce. Oyster sauce. Cocktail sauce. Horseradish that you find on the shelf. Ketchup. Mustard. Meat flavorings and tenderizers. Bouillon cubes. Hot sauce and Tabasco sauce. Premade or packaged marinades. Premade or packaged taco seasonings. Relishes. Regular salad dressings. Where to find more information:  National Heart, Lung, and Blood Institute: www.nhlbi.nih.gov  American Heart Association: www.heart.org Summary  The DASH eating plan is a healthy eating plan that has been shown to reduce high blood pressure (hypertension). It may also reduce your risk for type 2 diabetes, heart disease, and  stroke.  With the DASH eating plan, you should limit salt (sodium) intake to 2,300 mg a day. If you have hypertension, you may need to reduce your sodium intake to 1,500 mg a day.  When on the DASH eating plan, aim to eat more fresh fruits and vegetables, whole grains, lean proteins, low-fat dairy, and heart-healthy fats.  Work with your health care provider or diet and nutrition specialist (dietitian) to adjust your eating plan to your individual calorie needs. This information is not intended to replace advice given to you by your health care provider. Make sure you discuss any questions you have with your health care provider. Document Released: 08/14/2011 Document Revised: 08/18/2016 Document Reviewed: 08/18/2016 Elsevier Interactive Patient Education  2017 Elsevier Inc.  

## 2017-06-08 NOTE — Progress Notes (Signed)
Name: Alan Mathews   MRN: [id number]    DOB: 02/28/1971   Date:06/08/2017       Progress Note  Subjective  Chief Complaint  Chief Complaint  Patient presents with  . Hypertension    medication refills  . Allergic Rhinitis     sneezing, nose stopped up  . Spasms    feels like a muscle spasm in low back, only when lying    HPI  Allergic Rhinitis: Pt reports 1 day of sneezing, rhinorrhea, and nasal congestion. No cough, fevers/chills, body aches, nausea/vomiting/diarrhea. Has history of AR and has not been taking Claritin - would like to try something different today.  Declines nasal spray today, so we will try Xyzal alone.  Back Pain/Spasm: At night when he lays down, his Left mid-low back feels like the muscles are "jumping" - does not always hurt, but it does keep him up at night.  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  Abnormal Weight Loss: Has been worried because he thinks he's been losing a lot of weight.  He has been more active over the summer.  He states he was 175lbs back in April 2018, and now he is weighing in at 164lbs at home. He is 171lbs today in office, which is stable from his last visit in December 2017.  Denies decreased appetite, feels like he eats a lot but can't gain weight. Endorses polyphagia, polydipsia, and polyuria; also increased fatigue.  Denies cough - never used tobacco products, no ETOH use, no family history of colorectal cancer, prostate or breast cancer; no blood in stool or dark/tarry stools.   Rash: States he was working outdoors and got a rash to BUE - he used calamine lotion and the LUE rash healed, but the RUE upper medial arm still has a small rash that is only mildly itchy and not painful.  HTN: Has been out of medication since February 2018.  He does not check BP at home. He does endorse occasional headaches but are usually caused by stress. Denies blurred vision or BLE swelling, no chest pain or shortness  of breath.  BP is below goal today after being out of medication for 8 months, so we will hold off on restarting medications.  DASH diet discussed.  Patient Active Problem List   Diagnosis Date Noted  . Annual physical exam 08/08/2016  . GERD (gastroesophageal reflux disease) 06/30/2016  . Productive cough 12/11/2015  . Essential hypertension 04/03/2015  . Allergic rhinitis 02/19/2015  . Anxiety 02/19/2015  . Lactose intolerance 02/19/2015  . Adiposity 02/19/2015  . Obstructive apnea 02/19/2015  . BP (high blood pressure) 12/28/2014    Social History  Substance Use Topics  . Smoking status: Never Smoker  . Smokeless tobacco: Never Used  . Alcohol use No     Current Outpatient Prescriptions:  .  lisinopril-hydrochlorothiazide (PRINZIDE,ZESTORETIC) 20-12.5 MG tablet, TAKE 1 TABLET BY MOUTH DAILY., Disp: 30 tablet, Rfl: 1 .  omeprazole (PRILOSEC) 20 MG capsule, TAKE 1 CAPSULE (20 MG TOTAL) BY MOUTH DAILY., Disp: 90 capsule, Rfl: 1 .  Vitamin D, Ergocalciferol, (DRISDOL) 50000 units CAPS capsule, Take 1 capsule (50,000 Units total) by mouth once a week. For 12 weeks (Patient not taking: Reported on 06/08/2017), Disp: 12 capsule, Rfl: 0  No Known Allergies  ROS  Constitutional: Negative for fever or weight change.  Respiratory: Negative for cough and shortness of breath.   Cardiovascular: Negative for chest pain or palpitations.  Gastrointestinal: Negative for abdominal pain, no bowel changes.  Musculoskeletal: Negative for gait problem or joint swelling.  Skin: Negative for rash.  Neurological: Negative for dizziness or headache.  No other specific complaints in a complete review of systems (except as listed in HPI above).  Objective  Vitals:   06/08/17 0907  BP: 130/80  Pulse: 64  Resp: 18  Temp: 98.5 F (36.9 C)  TempSrc: Oral  SpO2: 98%  Weight: 171 lb 6.4 oz (77.7 kg)  Height:  (1.676 m)   Body mass index is 27.66 kg/m.  Nursing Note and Vital Signs  reviewed.  Physical Exam  Constitutional: Patient appears well-developed and well-nourished. Obese  No distress.  HEENT: head atraumatic, normocephalic, pupils equal and reactive to light, EOM's intact, TM's without erythema or bulging, no maxillary or frontal sinus pain on palpation, neck supple without lymphadenopathy, oropharynx pink and moist without exudate Cardiovascular: Normal rate, regular rhythm, S1/S2 present.  No murmur or rub heard. No BLE edema. Pulmonary/Chest: Effort normal and breath sounds clear. No respiratory distress or retractions. Abdominal: Soft and non-tender, bowel sounds present x4 quadrants.  No CVA Tenderness. Psychiatric: Patient has a normal mood and affect. behavior is normal. Judgment and thought content normal. Skin: RIGHT upper medial arm has ovoid lesion with whitened, raised border,central clearing, no underlying erythema, no excoriation, drainage or bleeding.  Non-tender to touch.  No results found for this or any previous visit (from the past 2160 hour(s)).   Assessment & Plan  1. Seasonal allergic rhinitis due to pollen - levocetirizine (XYZAL) 5 MG tablet; Take 1 tablet (5 mg total) by mouth every evening.  Dispense: 30 tablet; Refill: 1  2. Abnormal weight loss - TSH - COMPLETE METABOLIC PANEL WITH GFR - CBC w/Diff/Platelet  3. Fatigue, unspecified type - TSH - COMPLETE METABOLIC PANEL WITH GFR - CBC w/Diff/Platelet - Magnesium - Vitamin B12 - VITAMIN D 25 Hydroxy (Vit-D Deficiency, Fractures)  4. Decreased appetite - COMPLETE METABOLIC PANEL WITH GFR - CBC w/Diff/Platelet  5. Essential hypertension BP is at goal today, we will not refill medications.  DASH diet reinforced.  6. Muscle spasm - Magnesium - baclofen (LIORESAL) 10 MG tablet; Take 0.5-1 tablets (5-10 mg total) by mouth at bedtime as needed for muscle spasms.  Dispense: 10 each; Refill: 0  7. Tinea corporis - ketoconazole (NIZORAL) 2 % cream; Apply 1 application  topically daily.  Dispense: 15 g; Refill: 0

## 2017-06-09 ENCOUNTER — Other Ambulatory Visit: Payer: Self-pay | Admitting: Family Medicine

## 2017-06-09 DIAGNOSIS — E559 Vitamin D deficiency, unspecified: Secondary | ICD-10-CM | POA: Insufficient documentation

## 2017-06-09 LAB — COMPLETE METABOLIC PANEL WITH GFR
AG Ratio: 2 (calc) (ref 1.0–2.5)
ALT: 15 U/L (ref 9–46)
AST: 15 U/L (ref 10–40)
Albumin: 4.3 g/dL (ref 3.6–5.1)
Alkaline phosphatase (APISO): 88 U/L (ref 40–115)
BUN: 16 mg/dL (ref 7–25)
CO2: 27 mmol/L (ref 20–32)
Calcium: 9.2 mg/dL (ref 8.6–10.3)
Chloride: 107 mmol/L (ref 98–110)
Creat: 1.01 mg/dL (ref 0.60–1.35)
GFR, Est African American: 103 mL/min/{1.73_m2} (ref >=60)
GFR, Est Non African American: 89 mL/min/{1.73_m2} (ref >=60)
Globulin: 2.2 g/dL (calc) (ref 1.9–3.7)
Glucose, Bld: 91 mg/dL (ref 65–99)
Potassium: 4.8 mmol/L (ref 3.5–5.3)
Sodium: 140 mmol/L (ref 135–146)
Total Bilirubin: 0.3 mg/dL (ref 0.2–1.2)
Total Protein: 6.5 g/dL (ref 6.1–8.1)

## 2017-06-09 LAB — CBC WITH DIFFERENTIAL/PLATELET
Basophils Absolute: 50 cells/uL (ref 0–200)
Basophils Relative: 0.9 %
Eosinophils Absolute: 129 cells/uL (ref 15–500)
Eosinophils Relative: 2.3 %
HCT: 43.9 % (ref 38.5–50.0)
Hemoglobin: 14.9 g/dL (ref 13.2–17.1)
Lymphs Abs: 1355 cells/uL (ref 850–3900)
MCH: 28.5 pg (ref 27.0–33.0)
MCHC: 33.9 g/dL (ref 32.0–36.0)
MCV: 84.1 fL (ref 80.0–100.0)
MPV: 10.7 fL (ref 7.5–12.5)
Monocytes Relative: 8.4 %
Neutro Abs: 3595 cells/uL (ref 1500–7800)
Neutrophils Relative %: 64.2 %
Platelets: 236 10*3/uL (ref 140–400)
RBC: 5.22 10*6/uL (ref 4.20–5.80)
RDW: 13.7 % (ref 11.0–15.0)
Total Lymphocyte: 24.2 %
WBC mixed population: 470 cells/uL (ref 200–950)
WBC: 5.6 10*3/uL (ref 3.8–10.8)

## 2017-06-09 LAB — MAGNESIUM: Magnesium: 1.9 mg/dL (ref 1.5–2.5)

## 2017-06-09 LAB — TSH: TSH: 1.29 mIU/L (ref 0.40–4.50)

## 2017-06-09 LAB — VITAMIN D 25 HYDROXY (VIT D DEFICIENCY, FRACTURES): Vit D, 25-Hydroxy: 17 ng/mL — ABNORMAL LOW (ref 30–100)

## 2017-06-09 LAB — VITAMIN B12: Vitamin B-12: 413 pg/mL (ref 200–1100)

## 2017-06-09 MED ORDER — VITAMIN D (ERGOCALCIFEROL) 1.25 MG (50000 UNIT) PO CAPS: 50000.0000 [IU] | ORAL_CAPSULE | ORAL | 0 refills | Status: DC

## 2017-06-26 ENCOUNTER — Other Ambulatory Visit: Payer: Self-pay | Admitting: Family Medicine

## 2017-06-26 DIAGNOSIS — K219 Gastro-esophageal reflux disease without esophagitis: Secondary | ICD-10-CM

## 2017-08-03 ENCOUNTER — Other Ambulatory Visit: Payer: Self-pay | Admitting: Family Medicine

## 2017-08-03 DIAGNOSIS — J301 Allergic rhinitis due to pollen: Secondary | ICD-10-CM

## 2017-08-30 ENCOUNTER — Other Ambulatory Visit: Payer: Self-pay | Admitting: Family Medicine

## 2017-08-30 DIAGNOSIS — E559 Vitamin D deficiency, unspecified: Secondary | ICD-10-CM

## 2017-08-31 NOTE — Telephone Encounter (Signed)
Patient called.  Patient aware.  

## 2017-08-31 NOTE — Telephone Encounter (Signed)
Please notify pt to start taking OTC Vitamin D supplementation at either 1000IU daily or 2000IU every other day.  No need to continue Rx 50,000 IU at this time.

## 2017-09-28 ENCOUNTER — Ambulatory Visit: Payer: BLUE CROSS/BLUE SHIELD | Admitting: Family Medicine

## 2017-09-28 ENCOUNTER — Encounter: Payer: Self-pay | Admitting: Family Medicine

## 2017-09-28 ENCOUNTER — Encounter: Payer: Self-pay | Admitting: Emergency Medicine

## 2017-09-28 VITALS — BP 148/82 | HR 72 | Temp 98.8°F | Resp 16 | Ht 66.0 in | Wt 181.1 lb

## 2017-09-28 DIAGNOSIS — I10 Essential (primary) hypertension: Secondary | ICD-10-CM

## 2017-09-28 DIAGNOSIS — J069 Acute upper respiratory infection, unspecified: Secondary | ICD-10-CM | POA: Diagnosis not present

## 2017-09-28 MED ORDER — HYDROCHLOROTHIAZIDE 12.5 MG PO TABS: 12.5000 mg | ORAL_TABLET | Freq: Every day | ORAL | 0 refills | Status: DC

## 2017-09-28 MED ORDER — BENZONATATE 100 MG PO CAPS: 100.0000 mg | ORAL_CAPSULE | Freq: Two times a day (BID) | ORAL | 0 refills | Status: DC | PRN

## 2017-09-28 MED ORDER — FLUTICASONE PROPIONATE 50 MCG/ACT NA SUSP: 2.0000 | Freq: Every day | NASAL | 0 refills | Status: DC

## 2017-09-28 MED ORDER — HYDROCHLOROTHIAZIDE 12.5 MG PO TABS: 12.5000 mg | ORAL_TABLET | Freq: Every day | ORAL | 3 refills | Status: DC

## 2017-09-28 NOTE — Addendum Note (Signed)
Addended by: Doren CustardBOYCE, EMILY E on: 09/28/2017 12:05 PM   Modules accepted: Orders

## 2017-09-28 NOTE — Patient Instructions (Addendum)
- Please start taking Xyzal daily to help with your congestion and sneezing. - Please come FASTING to your next appointment.  Cool Mist Vaporizer A cool mist vaporizer is a device that releases a cool mist into the air. If you have a cough or a cold, using a vaporizer may help relieve your symptoms. The mist adds moisture to the air, which may help thin your mucus and make it less sticky. When your mucus is thin and less sticky, it easier for you to breathe and to cough up secretions. Do not use a vaporizer if you are allergic to mold. Follow these instructions at home:  Follow the instructions that come with the vaporizer.  Do not use anything other than distilled water in the vaporizer.  Do not run the vaporizer all of the time. Doing that can cause mold or bacteria to grow in the vaporizer.  Clean the vaporizer after each time that you use it.  Clean and dry the vaporizer well before storing it.  Stop using the vaporizer if your breathing symptoms get worse. This information is not intended to replace advice given to you by your health care provider. Make sure you discuss any questions you have with your health care provider. Document Released: 05/22/2004 Document Revised: 03/14/2016 Document Reviewed: 11/24/2015 Elsevier Interactive Patient Education  2018 Else[employer] Inc.   Upper Respiratory Infection, Adult Most upper respiratory infections (URIs) are caused by a virus. A URI affects the nose, throat, and upper air passages. The most common type of URI is often called "the common cold." Follow these instructions at home:  Take medicines only as told by your doctor.  Gargle warm saltwater or take cough drops to comfort your throat as told by your doctor.  Use a warm mist humidifier or inhale steam from a shower to increase air moisture. This may make it easier to breathe.  Drink enough fluid to keep your pee (urine) clear or pale yellow.  Eat soups and other clear  broths.  Have a healthy diet.  Rest as needed.  Go back to work when your fever is gone or your doctor says it is okay. ? You may need to stay home longer to avoid giving your URI to others. ? You can also wear a face mask and wash your hands often to prevent spread of the virus.  Use your inhaler more if you have asthma.  Do not use any tobacco products, including cigarettes, chewing tobacco, or electronic cigarettes. If you need help quitting, ask your doctor. Contact a doctor if:  You are getting worse, not better.  Your symptoms are not helped by medicine.  You have chills.  You are getting more short of breath.  You have brown or red mucus.  You have yellow or brown discharge from your nose.  You have pain in your face, especially when you bend forward.  You have a fever.  You have puffy (swollen) neck glands.  You have pain while swallowing.  You have white areas in the back of your throat. Get help right away if:  You have very bad or constant: ? Headache. ? Ear pain. ? Pain in your forehead, behind your eyes, and over your cheekbones (sinus pain). ? Chest pain.  You have long-lasting (chronic) lung disease and any of the following: ? Wheezing. ? Long-lasting cough. ? Coughing up blood. ? A change in your usual mucus.  You have a stiff neck.  You have changes in your: ? Vision. ?  Hearing. ? Thinking. ? Mood. This information is not intended to replace advice given to you by your health care provider. Make sure you discuss any questions you have with your health care provider. Document Released: 02/11/2008 Document Revised: 04/27/2016 Document Reviewed: 11/30/2013 Elsevier Interactive Patient Education  2018 [employer].

## 2017-09-28 NOTE — Progress Notes (Signed)
Name: Alan Mathews   MRN: [id number]    DOB: 09-10-1970   Date:09/28/2017       Progress Note  Subjective  Chief Complaint  Chief Complaint  Patient presents with  . URI    cough, congested, headache, sneezing, runny nose for 4 days    HPI  Pt presents with 4-5 days of cough (mildly productive), sneezing, headache, rhinorrhea, chills, nausea. Denies fevers, vomiting, diarrhea, chest pain, shortness of breath.  Father and friend is recent sick contacts. Has not been taking Xyzal - has refills at the pharmacy.    Hx HTN: Pt's BP is elevated today - we will recheck.  He went off of his medication in 2017, and never restarted because his BP had been at goal at his last visit. Endorses headaches.  Denies blurred vision, chest pain, shortness of breath, BLE edema, palpitations.  He has taken Lisinopril- HCTZ in the past and done well on it.  Patient Active Problem List   Diagnosis Date Noted  . Vitamin D deficiency 06/09/2017  . Annual physical exam 08/08/2016  . GERD (gastroesophageal reflux disease) 06/30/2016  . Productive cough 12/11/2015  . Essential hypertension 04/03/2015  . Allergic rhinitis 02/19/2015  . Anxiety 02/19/2015  . Lactose intolerance 02/19/2015  . Adiposity 02/19/2015  . Obstructive apnea 02/19/2015  . BP (high blood pressure) 12/28/2014    Social History   Tobacco Use  . Smoking status: Never Smoker  . Smokeless tobacco: Never Used  Substance Use Topics  . Alcohol use: No     Current Outpatient Medications:  .  levocetirizine (XYZAL) 5 MG tablet, TAKE 1 TABLET BY MOUTH EVERY DAY IN THE EVENING, Disp: 90 tablet, Rfl: 3 .  omeprazole (PRILOSEC) 20 MG capsule, TAKE 1 CAPSULE (20 MG TOTAL) BY MOUTH DAILY., Disp: 90 capsule, Rfl: 1 .  benzonatate (TESSALON) 100 MG capsule, Take 1-2 capsules (100-200 mg total) by mouth 2 (two) times daily as needed for cough., Disp: 30 capsule, Rfl: 0 .  fluticasone (FLONASE) 50 MCG/ACT nasal spray, Place 2 sprays into both  nostrils daily., Disp: 16 g, Rfl: 0  No Known Allergies  ROS  Ten systems reviewed and is negative except as mentioned in HPI  Objective  Vitals:   09/28/17 1117 09/28/17 1156  BP: (!) 160/80 (!) 148/82  Pulse: 72   Resp: 16   Temp: 98.8 F (37.1 C)   TempSrc: Oral   SpO2: 99%   Weight: 181 lb 1.6 oz (82.1 kg)   Height: 5\' 6"  (1.676 m)     Body mass index is 29.23 kg/m.  Nursing Note and Vital Signs reviewed.  Physical Exam Constitutional: Patient appears well-developed and well-nourished.  No distress.  HEENT: head atraumatic, normocephalic, pupils equal and reactive to light, EOM's intact, TM's without erythema or bulging, no maxillary or frontal sinus tenderness , neck supple without lymphadenopathy, oropharynx pink and moist without exudate Cardiovascular: Normal rate, regular rhythm, S1/S2 present.  No murmur or rub heard. No BLE edema. Pulmonary/Chest: Effort normal and breath sounds clear. No respiratory distress or retractions. Psychiatric: Patient has a normal mood and affect. behavior is normal. Judgment and thought content normal.  No results found for this or any previous visit (from the past 72 hour(s)).  Assessment & Plan  1. Upper respiratory tract infection, unspecified type - Restart Xyzal daily as prescribed - pt should have refills at the pharmacy, will call if needing new Rx. - fluticasone (FLONASE) 50 MCG/ACT nasal spray; Place 2 sprays into  both nostrils daily.  Dispense: 16 g; Refill: 0 - benzonatate (TESSALON) 100 MG capsule; Take 1-2 capsules (100-200 mg total) by mouth 2 (two) times daily as needed for cough.  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  2. Essential hypertension - hydrochlorothiazide (HYDRODIURIL) 12.5 MG tablet; Take 1 tablet (12.5 mg total) by mouth daily.  Dispense: 90 tablet; Refill: 3 - Return in 2 weeks for follow up w/ BP check and fasting labs.  -Red flags and when to  present for emergency care or RTC including fever >101.64F, chest pain, shortness of breath, new/worsening/un-resolving symptoms, reviewed with patient at time of visit. Follow up and care instructions discussed and provided in AVS.

## 2017-10-12 ENCOUNTER — Ambulatory Visit: Payer: BLUE CROSS/BLUE SHIELD | Admitting: Family Medicine

## 2017-10-12 ENCOUNTER — Encounter: Payer: Self-pay | Admitting: Family Medicine

## 2017-10-12 VITALS — BP 142/82 | HR 72 | Temp 98.5°F | Resp 18 | Ht 66.0 in | Wt 179.7 lb

## 2017-10-12 DIAGNOSIS — R9431 Abnormal electrocardiogram [ECG] [EKG]: Secondary | ICD-10-CM | POA: Diagnosis not present

## 2017-10-12 DIAGNOSIS — R0789 Other chest pain: Secondary | ICD-10-CM | POA: Diagnosis not present

## 2017-10-12 DIAGNOSIS — Z13220 Encounter for screening for lipoid disorders: Secondary | ICD-10-CM

## 2017-10-12 DIAGNOSIS — I10 Essential (primary) hypertension: Secondary | ICD-10-CM

## 2017-10-12 LAB — BASIC METABOLIC PANEL WITH GFR
BUN: 14 mg/dL (ref 7–25)
CO2: 26 mmol/L (ref 20–32)
Calcium: 9.4 mg/dL (ref 8.6–10.3)
Chloride: 109 mmol/L (ref 98–110)
Creat: 0.99 mg/dL (ref 0.60–1.35)
GFR, Est African American: 105 mL/min/{1.73_m2} (ref >=60)
GFR, Est Non African American: 90 mL/min/{1.73_m2} (ref >=60)
Glucose, Bld: 94 mg/dL (ref 65–139)
Potassium: 4.4 mmol/L (ref 3.5–5.3)
Sodium: 142 mmol/L (ref 135–146)

## 2017-10-12 LAB — LIPID PANEL
Cholesterol: 160 mg/dL (ref <200)
HDL: 55 mg/dL (ref >40)
LDL Cholesterol (Calc): 89 mg/dL (calc)
Non-HDL Cholesterol (Calc): 105 mg/dL (calc) (ref <130)
Total CHOL/HDL Ratio: 2.9 (calc) (ref <5.0)
Triglycerides: 74 mg/dL (ref <150)

## 2017-10-12 MED ORDER — LISINOPRIL 10 MG PO TABS: 10.0000 mg | ORAL_TABLET | Freq: Every day | ORAL | 0 refills | Status: DC

## 2017-10-12 NOTE — Patient Instructions (Signed)
DASH Eating Plan DASH stands for "Dietary Approaches to Stop Hypertension." The DASH eating plan is a healthy eating plan that has been shown to reduce high blood pressure (hypertension). It may also reduce your risk for type 2 diabetes, heart disease, and stroke. The DASH eating plan may also help with weight loss. What are tips for following this plan? General guidelines  Avoid eating more than 2,300 mg (milligrams) of salt (sodium) a day. If you have hypertension, you may need to reduce your sodium intake to 1,500 mg a day.  Limit alcohol intake to no more than 1 drink a day for nonpregnant women and 2 drinks a day for men. One drink equals 12 oz of beer, 5 oz of wine, or 1 oz of hard liquor.  Work with your health care provider to maintain a healthy body weight or to lose weight. Ask what an ideal weight is for you.  Get at least 30 minutes of exercise that causes your heart to beat faster (aerobic exercise) most days of the week. Activities may include walking, swimming, or biking.  Work with your health care provider or diet and nutrition specialist (dietitian) to adjust your eating plan to your individual calorie needs. Reading food labels  Check food labels for the amount of sodium per serving. Choose foods with less than 5 percent of the Daily Value of sodium. Generally, foods with less than 300 mg of sodium per serving fit into this eating plan.  To find whole grains, look for the word "whole" as the first word in the ingredient list. Shopping  Buy products labeled as "low-sodium" or "no salt added."  Buy fresh foods. Avoid canned foods and premade or frozen meals. Cooking  Avoid adding salt when cooking. Use salt-free seasonings or herbs instead of table salt or sea salt. Check with your health care provider or pharmacist before using salt substitutes.  Do not fry foods. Cook foods using healthy methods such as baking, boiling, grilling, and broiling instead.  Cook with  heart-healthy oils, such as olive, canola, soybean, or sunflower oil. Meal planning   Eat a balanced diet that includes: ? 5 or more servings of fruits and vegetables each day. At each meal, try to fill half of your plate with fruits and vegetables. ? Up to 6-8 servings of whole grains each day. ? Less than 6 oz of lean meat, poultry, or fish each day. A 3-oz serving of meat is about the same size as a deck of cards. One egg equals 1 oz. ? 2 servings of low-fat dairy each day. ? A serving of nuts, seeds, or beans 5 times each week. ? Heart-healthy fats. Healthy fats called Omega-3 fatty acids are found in foods such as flaxseeds and coldwater fish, like sardines, salmon, and mackerel.  Limit how much you eat of the following: ? Canned or prepackaged foods. ? Food that is high in trans fat, such as fried foods. ? Food that is high in saturated fat, such as fatty meat. ? Sweets, desserts, sugary drinks, and other foods with added sugar. ? Full-fat dairy products.  Do not salt foods before eating.  Try to eat at least 2 vegetarian meals each week.  Eat more home-cooked food and less restaurant, buffet, and fast food.  When eating at a restaurant, ask that your food be prepared with less salt or no salt, if possible. What foods are recommended? The items listed may not be a complete list. Talk with your dietitian about what   dietary choices are best for you. Grains Whole-grain or whole-wheat bread. Whole-grain or whole-wheat pasta. Brown rice. Oatmeal. Quinoa. Bulgur. Whole-grain and low-sodium cereals. Pita bread. Low-fat, low-sodium crackers. Whole-wheat flour tortillas. Vegetables Fresh or frozen vegetables (raw, steamed, roasted, or grilled). Low-sodium or reduced-sodium tomato and vegetable juice. Low-sodium or reduced-sodium tomato sauce and tomato paste. Low-sodium or reduced-sodium canned vegetables. Fruits All fresh, dried, or frozen fruit. Canned fruit in natural juice (without  added sugar). Meat and other protein foods Skinless chicken or turkey. Ground chicken or turkey. Pork with fat trimmed off. Fish and seafood. Egg whites. Dried beans, peas, or lentils. Unsalted nuts, nut butters, and seeds. Unsalted canned beans. Lean cuts of beef with fat trimmed off. Low-sodium, lean deli meat. Dairy Low-fat (1%) or fat-free (skim) milk. Fat-free, low-fat, or reduced-fat cheeses. Nonfat, low-sodium ricotta or cottage cheese. Low-fat or nonfat yogurt. Low-fat, low-sodium cheese. Fats and oils Soft margarine without trans fats. Vegetable oil. Low-fat, reduced-fat, or light mayonnaise and salad dressings (reduced-sodium). Canola, safflower, olive, soybean, and sunflower oils. Avocado. Seasoning and other foods Herbs. Spices. Seasoning mixes without salt. Unsalted popcorn and pretzels. Fat-free sweets. What foods are not recommended? The items listed may not be a complete list. Talk with your dietitian about what dietary choices are best for you. Grains Baked goods made with fat, such as croissants, muffins, or some breads. Dry pasta or rice meal packs. Vegetables Creamed or fried vegetables. Vegetables in a cheese sauce. Regular canned vegetables (not low-sodium or reduced-sodium). Regular canned tomato sauce and paste (not low-sodium or reduced-sodium). Regular tomato and vegetable juice (not low-sodium or reduced-sodium). Pickles. Olives. Fruits Canned fruit in a light or heavy syrup. Fried fruit. Fruit in cream or butter sauce. Meat and other protein foods Fatty cuts of meat. Ribs. Fried meat. Bacon. Sausage. Bologna and other processed lunch meats. Salami. Fatback. Hotdogs. Bratwurst. Salted nuts and seeds. Canned beans with added salt. Canned or smoked fish. Whole eggs or egg yolks. Chicken or turkey with skin. Dairy Whole or 2% milk, cream, and half-and-half. Whole or full-fat cream cheese. Whole-fat or sweetened yogurt. Full-fat cheese. Nondairy creamers. Whipped toppings.  Processed cheese and cheese spreads. Fats and oils Butter. Stick margarine. Lard. Shortening. Ghee. Bacon fat. Tropical oils, such as coconut, palm kernel, or palm oil. Seasoning and other foods Salted popcorn and pretzels. Onion salt, garlic salt, seasoned salt, table salt, and sea salt. Worcestershire sauce. Tartar sauce. Barbecue sauce. Teriyaki sauce. Soy sauce, including reduced-sodium. Steak sauce. Canned and packaged gravies. Fish sauce. Oyster sauce. Cocktail sauce. Horseradish that you find on the shelf. Ketchup. Mustard. Meat flavorings and tenderizers. Bouillon cubes. Hot sauce and Tabasco sauce. Premade or packaged marinades. Premade or packaged taco seasonings. Relishes. Regular salad dressings. Where to find more information:  National Heart, Lung, and Blood Institute: www.nhlbi.nih.gov  American Heart Association: www.heart.org Summary  The DASH eating plan is a healthy eating plan that has been shown to reduce high blood pressure (hypertension). It may also reduce your risk for type 2 diabetes, heart disease, and stroke.  With the DASH eating plan, you should limit salt (sodium) intake to 2,300 mg a day. If you have hypertension, you may need to reduce your sodium intake to 1,500 mg a day.  When on the DASH eating plan, aim to eat more fresh fruits and vegetables, whole grains, lean proteins, low-fat dairy, and heart-healthy fats.  Work with your health care provider or diet and nutrition specialist (dietitian) to adjust your eating plan to your individual   calorie needs. This information is not intended to replace advice given to you by your health care provider. Make sure you discuss any questions you have with your health care provider. Document Released: 08/14/2011 Document Revised: 08/18/2016 Document Reviewed: 08/18/2016 Elsevier Interactive Patient Education  2018 Elsevier Inc.  

## 2017-10-12 NOTE — Progress Notes (Signed)
Name: Alan Mathews   MRN: [id number]    DOB: 1970/10/20   Date:10/12/2017       Progress Note  Subjective  Chief Complaint  Chief Complaint  Patient presents with  . Follow-up    BP check, do not like HCTZ makes him have chest pains  . Follow-up    URI, feeling much better    HPI  Pt presents for BP follow up - started back on HCTZ (Had been taking Lisinopril-HCTZ in the past) after having an elevated BP reading in office w/ hx HTN.  He notes that he has been taking HCTZ every now and then because he noticed that on days that he takes the medication he has some mild chest discomfort.  On days that he does not take the medication, he has no chest discomfort at all.  He requests to be switched to Lisinopril, which we will do today.  He denies BLE edema, shortness of breath, headaches, dizziness.  Denies prior heart issues or stroke.  Checking blood pressure away from here? no Hypertension-associated complications:  none Siblings / family history: Does high blood pressure run in your family?   YES - Dad, brother Salt:  Trying to limit sodium / salt when buying foods at the grocery store?  NO - he is trying to sometimes, but has not been doing well.  Discussed DASH diet in detail. Sweets/licorice:  Do you eat a lot of sweets or eat black licorice?  eats a lot of sweets; does not eat black licorice Saturated fats: Do you eat a lot of foods like bacon, sausage, pepperoni, cheeseburgers, hot dogs, bologna, and cheese?  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  no  Do you take prednisone or prescription NSAIDs or take OTCS NSAIDs such as ibuprofen, Motrin, Advil, Aleve, or naproxen? no  Smoking: Do you smoke?  no Snoring / sleep  apnea: Do you snore or have sleep apnea?  Was going to go to a sleep study, but didn't have time; declines referral today Stress: Do you feel like you are under excessive stress or that your stress level affects your blood pressure at times?    YES - has a lot going on  - feels like he doesn't have enough time to spend with loved ones because of working so many hours. Stroh's (alcohol): Do you drink alcohol  no Sudafed (decongestants): Do you use any OTC decongestant products like Allegra-D, Claritin-D, Zyrtec-D, Tylenol Cold and Sinus, etc.?  no   Patient Active Problem List   Diagnosis Date Noted  . Vitamin D deficiency 06/09/2017  . Annual physical exam 08/08/2016  . GERD (gastroesophageal reflux disease) 06/30/2016  . Productive cough 12/11/2015  . Essential hypertension 04/03/2015  . Allergic rhinitis 02/19/2015  . Anxiety 02/19/2015  . Lactose intolerance 02/19/2015  . Adiposity 02/19/2015  . Obstructive apnea 02/19/2015  . BP (high blood pressure) 12/28/2014    Social History   Tobacco Use  . Smoking status: Never Smoker  . Smokeless tobacco: Never Used  Substance Use Topics  . Alcohol use: No     Current Outpatient Medications:  .  hydrochlorothiazide (HYDRODIURIL) 12.5 MG tablet, Take 1 tablet (12.5 mg total) by mouth daily., Disp: 30 tablet, Rfl:  0 .  levocetirizine (XYZAL) 5 MG tablet, TAKE 1 TABLET BY MOUTH EVERY DAY IN THE EVENING, Disp: 90 tablet, Rfl: 3 .  omeprazole (PRILOSEC) 20 MG capsule, TAKE 1 CAPSULE (20 MG TOTAL) BY MOUTH DAILY., Disp: 90 capsule, Rfl: 1 .  benzonatate (TESSALON) 100 MG capsule, Take 1-2 capsules (100-200 mg total) by mouth 2 (two) times daily as needed for cough. (Patient not taking: Reported on 10/12/2017), Disp: 30 capsule, Rfl: 0 .  fluticasone (FLONASE) 50 MCG/ACT nasal spray, Place 2 sprays into both nostrils daily. (Patient not taking: Reported on 10/12/2017), Disp: 16 g, Rfl: 0  No Known Allergies  ROS  Constitutional: Negative for  fever or weight change.  Respiratory: Negative for cough and shortness of breath.   Cardiovascular: See HPI. Gastrointestinal: Negative for abdominal pain, no bowel changes.  Musculoskeletal: Negative for gait problem or joint swelling.  Skin: Negative for rash.  Neurological: Negative for dizziness or headache.  No other specific complaints in a complete review of systems (except as listed in HPI above).  Objective  Vitals:   10/12/17 1332  BP: (!) 142/82  Pulse: 72  Resp: 18  Temp: 98.5 F (36.9 C)  TempSrc: Oral  SpO2: 97%  Weight: 179 lb 11.2 oz (81.5 kg)  Height: 5\' 6"  (1.676 m)   Body mass index is 29 kg/m.  Nursing Note and Vital Signs reviewed.  Physical Exam  Constitutional: Patient appears well-developed and well-nourished. Obese. No distress.  HEENT: head atraumatic, normocephalic Cardiovascular: Normal rate, regular rhythm, S1/S2 present.  No murmur or rub heard. No BLE edema. Pulmonary/Chest: Effort normal and breath sounds clear. No respiratory distress or retractions. Psychiatric: Patient has a normal mood and affect. behavior is normal. Judgment and thought content normal.  No results found for this or any previous visit (from the past 72 hour(s)).  Assessment & Plan  1. Essential hypertension - EKG 12-Lead - EKG shows possible BBB in V2 only; we will refer to cardiology for further evaluation - lisinopril (PRINIVIL,ZESTRIL) 10 MG tablet; Take 1 tablet (10 mg total) by mouth daily.  Dispense: 45 tablet; Refill: 0 - BASIC METABOLIC PANEL WITH GFR - Lipid panel - Ambulatory referral to Cardiology  2. Chest discomfort - EKG 12-Lead - EKG shows possible BBB in V2 only; we will refer to cardiology for further evaluation - BASIC METABOLIC PANEL WITH GFR - Lipid panel - Ambulatory referral to Cardiology  3. Screening for hyperlipidemia - Lipid panel  4. Abnormal ECG - Ambulatory referral to Cardiology  -Red flags and when to present for emergency  care or RTC including fever >101.71F, chest pain, shortness of breath, new/worsening/un-resolving symptoms, reviewed with patient at time of visit. Follow up and care instructions discussed and provided in AVS.

## 2017-10-26 ENCOUNTER — Ambulatory Visit (INDEPENDENT_AMBULATORY_CARE_PROVIDER_SITE_OTHER): Payer: BLUE CROSS/BLUE SHIELD | Admitting: Family Medicine

## 2017-10-26 VITALS — BP 132/70

## 2017-10-26 DIAGNOSIS — I10 Essential (primary) hypertension: Secondary | ICD-10-CM

## 2017-10-26 NOTE — Progress Notes (Signed)
bp is at goal  ?

## 2017-11-07 ENCOUNTER — Other Ambulatory Visit: Payer: Self-pay | Admitting: Family Medicine

## 2017-11-07 DIAGNOSIS — I10 Essential (primary) hypertension: Secondary | ICD-10-CM

## 2017-11-12 ENCOUNTER — Encounter: Payer: Self-pay | Admitting: Family Medicine

## 2017-11-12 ENCOUNTER — Ambulatory Visit (INDEPENDENT_AMBULATORY_CARE_PROVIDER_SITE_OTHER): Payer: BLUE CROSS/BLUE SHIELD | Admitting: Family Medicine

## 2017-11-12 ENCOUNTER — Telehealth: Payer: Self-pay | Admitting: General Surgery

## 2017-11-12 VITALS — BP 132/80 | HR 69 | Temp 98.0°F | Ht 66.0 in | Wt 172.4 lb

## 2017-11-12 DIAGNOSIS — I10 Essential (primary) hypertension: Secondary | ICD-10-CM | POA: Diagnosis not present

## 2017-11-12 DIAGNOSIS — Z0001 Encounter for general adult medical examination with abnormal findings: Secondary | ICD-10-CM

## 2017-11-12 DIAGNOSIS — E559 Vitamin D deficiency, unspecified: Secondary | ICD-10-CM | POA: Diagnosis not present

## 2017-11-12 DIAGNOSIS — K921 Melena: Secondary | ICD-10-CM | POA: Diagnosis not present

## 2017-11-12 DIAGNOSIS — Z0000 Encounter for general adult medical examination without abnormal findings: Secondary | ICD-10-CM | POA: Insufficient documentation

## 2017-11-12 DIAGNOSIS — K644 Residual hemorrhoidal skin tags: Secondary | ICD-10-CM | POA: Diagnosis not present

## 2017-11-12 DIAGNOSIS — K602 Anal fissure, unspecified: Secondary | ICD-10-CM

## 2017-11-12 DIAGNOSIS — Z1211 Encounter for screening for malignant neoplasm of colon: Secondary | ICD-10-CM

## 2017-11-12 DIAGNOSIS — Z113 Encounter for screening for infections with a predominantly sexual mode of transmission: Secondary | ICD-10-CM

## 2017-11-12 MED ORDER — VITAMIN D (ERGOCALCIFEROL) 1.25 MG (50000 UNIT) PO CAPS: 50000.0000 [IU] | ORAL_CAPSULE | ORAL | 2 refills | Status: AC

## 2017-11-12 MED ORDER — TETANUS-DIPHTH-ACELL PERTUSSIS 5-2.5-18.5 LF-MCG/0.5 IM SUSP: 0.5000 mL | Freq: Once | INTRAMUSCULAR | Status: DC

## 2017-11-12 NOTE — Assessment & Plan Note (Signed)
Refer to GI for evaluation; maybe hemorrhoids, but he has not had screening yet

## 2017-11-12 NOTE — Assessment & Plan Note (Signed)
USPSTF grade A and B recommendations reviewed with patient; age-appropriate recommendations, preventive care, screening tests, etc discussed and encouraged; healthy living encouraged; see AVS for patient education given to patient  

## 2017-11-12 NOTE — Assessment & Plan Note (Signed)
Try DASH guidelines 

## 2017-11-12 NOTE — Assessment & Plan Note (Signed)
Will do once a month vitamin D

## 2017-11-12 NOTE — Telephone Encounter (Signed)
I HAVE L/M FOR PATIENT TO RETURN CALL & SCHEDULE AN APPOINTMENT WITH DR BYRNETT SCREENING COLONOSCOPY(CONFIRM NON-PRIOR)DUE TO RECTAL BLEEDING,REF'D BY DR LADA.

## 2017-11-12 NOTE — Progress Notes (Signed)
BP 132/80 (BP Location: Left Arm, Patient Position: Sitting, Cuff Size: Large)   Pulse 69   Temp 98 F (36.7 C) (Oral)   Ht 5\' 6"  (1.676 m)   Wt 172 lb 6.4 oz (78.2 kg)   SpO2 99%   BMI 27.83 kg/m    Subjective:    Patient ID: Alan Mathews, male    DOB: April 13, 1971, 47 y.o.   MRN: [id number]  HPI: Alan Mathews is a 47 y.o. male  Chief Complaint  Patient presents with  . Annual Exam    HPI USPSTF grade A and B recommendations Depression:  [facility] 11/12/2017 09/28/2017 08/08/2016 12/11/2015 08/06/2015  Decreased Interest 0 0 0 0 0  Down, Depressed, Hopeless 0 0 0 0 0  PHQ - 2 Score 0 0 0 0 0   Hypertension: recheck was improved, 132/80 on the recheck; he has not taken his BP medicine this morning; might skip two days a week BP Readings from Last 3 Encounters:  11/12/17 132/80  10/26/17 132/70  10/12/17 (!) 142/82   Obesity: Wt Readings from Last 3 Encounters:  11/12/17 172 lb 6.4 oz (78.2 kg)  10/12/17 179 lb 11.2 oz (81.5 kg)  09/28/17 181 lb 1.6 oz (82.1 kg)   BMI Readings from Last 3 Encounters:  11/12/17 27.83 kg/m  10/12/17 29.00 kg/m  09/28/17 29.23 kg/m    Immunizations: tetanus UTD per chart, 03/28/13; declined flu Skin cancer: discussed s/s of skin cancer; nothing worrisome Lung cancer:  n/a Prostate cancer: no fam hx, no problems; check at 55 Lab Results  Component Value Date   PSA 1.0 08/08/2016   PSA 1.23 08/06/2015   PSA 1.1 07/18/2014   Colorectal cancer: start at age 47; no personal or fam hx;  AAA: n/a Aspirin: n/a Diet: eats everything; fried foods, lunch, dinner, skips breakfast, burger, fries, hot dogs, chicken; "tastes good"; occasional veggies and fruits, maybe twice a week Exercise: walks, cuts grass, pretty active, weed eating, landscaping Alcohol: no Tobacco use: never smoker HIV, hep B, hep C: already had testing; sometimes uses protection; discussed monogamous relationship if both partners tested STD testing and  prevention (chl/gon/syphilis): sometimes uses protection Glucose:  Glucose  Date Value Ref Range Status  08/09/2014 87 65 - 99 mg/dL Final  11/91/478211/03/2013 72 65 - 99 mg/dL Final   Glucose, Bld  Date Value Ref Range Status  10/12/2017 94 65 - 139 mg/dL Final    Comment:    .        Non-fasting reference interval .   06/08/2017 91 65 - 99 mg/dL Final    Comment:    .            Fasting reference interval .   08/08/2016 84 65 - 99 mg/dL Final   Lipids:  Lab Results  Component Value Date   CHOL 160 10/12/2017   CHOL 181 08/08/2016   CHOL 182 08/06/2015   Lab Results  Component Value Date   HDL 55 10/12/2017   HDL 58 08/08/2016   HDL 58 08/06/2015   Lab Results  Component Value Date   LDLCALC 111 (H) 08/08/2016   LDLCALC 108 (H) 08/06/2015   LDLCALC 91 08/09/2014   Lab Results  Component Value Date   TRIG 74 10/12/2017   TRIG 61 08/08/2016   TRIG 78 08/06/2015   Lab Results  Component Value Date   CHOLHDL 2.9 10/12/2017   CHOLHDL 3.1 08/08/2016   CHOLHDL 3.1 08/06/2015  No results found for: LDLDIRECT  Low vitamin D  Depression screen [facility] 2/9 11/12/2017 09/28/2017 08/08/2016 12/11/2015 08/06/2015  Decreased Interest 0 0 0 0 0  Down, Depressed, Hopeless 0 0 0 0 0  PHQ - 2 Score 0 0 0 0 0    Relevant past medical, surgical, family and social history reviewed Past Medical History:  Diagnosis Date  . Hypertension    Past Surgical History:  Procedure Laterality Date  . TONSILLECTOMY AND ADENOIDECTOMY  04/03/2010   Family History  Problem Relation Age of Onset  . Hypertension Mother   . Hypertension Father   . Hypertension Brother   . Aneurysm Maternal Grandmother    Social History   Tobacco Use  . Smoking status: Never Smoker  . Smokeless tobacco: Never Used  Substance Use Topics  . Alcohol use: No  . Drug use: No    Interim medical history since last visit reviewed. Allergies and medications reviewed  Review of Systems  Constitutional:  Negative for activity change, appetite change, chills, fever and unexpected weight change (patient says weight drop was from "clunky shoes" at previous visits, weight w/them on).  HENT: Negative for sore throat.   Eyes: Negative for visual disturbance.  Respiratory: Negative for cough (just a couple of coughs last week, resolved).   Cardiovascular: Negative for chest pain.  Gastrointestinal: Positive for blood in stool (when wiping, bright red blood; no hx of hemorrhoids, but thinks that what it might). Negative for diarrhea, nausea and vomiting.  Genitourinary:       No difficulties with urination   Per HPI unless specifically indicated above     Objective:    BP 132/80 (BP Location: Left Arm, Patient Position: Sitting, Cuff Size: Large)   Pulse 69   Temp 98 F (36.7 C) (Oral)   Ht 5\' 6"  (1.676 m)   Wt 172 lb 6.4 oz (78.2 kg)   SpO2 99%   BMI 27.83 kg/m   Wt Readings from Last 3 Encounters:  11/12/17 172 lb 6.4 oz (78.2 kg)  10/12/17 179 lb 11.2 oz (81.5 kg)  09/28/17 181 lb 1.6 oz (82.1 kg)    Physical Exam  Constitutional: He appears well-developed and well-nourished. No distress.  HENT:  Head: Normocephalic and atraumatic.  Nose: Nose normal.  Mouth/Throat: Oropharynx is clear and moist.  Eyes: EOM are normal. No scleral icterus.  Neck: No JVD present. No thyromegaly present.  Cardiovascular: Normal rate, regular rhythm and normal heart sounds.  Pulmonary/Chest: Effort normal and breath sounds normal. No respiratory distress. He has no wheezes. He has no rales.  Abdominal: Soft. Bowel sounds are normal. He exhibits no distension. There is no tenderness. There is no guarding.  Genitourinary: Rectal exam shows external hemorrhoid and fissure.  Musculoskeletal: Normal range of motion. He exhibits no edema.  Lymphadenopathy:    He has no cervical adenopathy.  Neurological: He is alert. He displays normal reflexes. He exhibits normal muscle tone. Coordination normal.  Skin:  Skin is warm and dry. No rash noted. He is not diaphoretic. No erythema. No pallor.  Psychiatric: He has a normal mood and affect. His behavior is normal. Judgment and thought content normal. His mood appears not anxious. He does not exhibit a depressed mood.   Results for orders placed or performed in visit on 10/12/17  BASIC METABOLIC PANEL WITH GFR  Result Value Ref Range   Glucose, Bld 94 65 - 139 mg/dL   BUN 14 7 - 25 mg/dL   Creat 4.54  0.60 - 1.35 mg/dL   GFR, Est Non African American 90 > OR = 60 mL/min/1.61m2   GFR, Est African American 105 > OR = 60 mL/min/1.10m2   BUN/Creatinine Ratio NOT APPLICABLE 6 - 22 (calc)   Sodium 142 135 - 146 mmol/L   Potassium 4.4 3.5 - 5.3 mmol/L   Chloride 109 98 - 110 mmol/L   CO2 26 20 - 32 mmol/L   Calcium 9.4 8.6 - 10.3 mg/dL  Lipid panel  Result Value Ref Range   Cholesterol 160 <200 mg/dL   HDL 55 >40 mg/dL   Triglycerides 74 <981 mg/dL   LDL Cholesterol (Calc) 89 mg/dL (calc)   Total CHOL/HDL Ratio 2.9 <5.0 (calc)   Non-HDL Cholesterol (Calc) 105 <130 mg/dL (calc)      Assessment & Plan:   Problem List Items Addressed This Visit      Cardiovascular and Mediastinum   Essential hypertension    Try DASH guidelines        Digestive   Blood in the stool    Refer to GI for evaluation; maybe hemorrhoids, but he has not had screening yet      Relevant Orders   Ambulatory referral to Gastroenterology     Other   Vitamin D deficiency    Will do once a month vitamin D      Preventative health care    USPSTF grade A and B recommendations reviewed with patient; age-appropriate recommendations, preventive care, screening tests, etc discussed and encouraged; healthy living encouraged; see AVS for patient education given to patient       Colon cancer screening    Refer to colonoscopy      Relevant Orders   Ambulatory referral to Gastroenterology    Other Visit Diagnoses    Encounter for annual physical exam    -  Primary    Screen for STD (sexually transmitted disease)       Relevant Orders   C. trachomatis/N. gonorrhoeae RNA   Hepatitis panel, acute   HIV antibody   RPR   Trichomonas vaginalis RNA, Ql,Males   External hemorrhoid, bleeding       encouraged fiber, water, refer to GI   Rectal fissure       refer to GI       Follow up plan: No Follow-up on file.  An after-visit summary was printed and given to the patient at check-out.  Please see the patient instructions which may contain other information and recommendations beyond what is mentioned above in the assessment and plan.  Meds ordered this encounter  Medications  . DISCONTD: Tdap (BOOSTRIX) injection 0.5 mL  . Vitamin D, Ergocalciferol, (DRISDOL) 50000 units CAPS capsule    Sig: Take 1 capsule (50,000 Units total) by mouth every 30 (thirty) days.    Dispense:  1 capsule    Refill:  2    Orders Placed This Encounter  Procedures  . C. trachomatis/N. gonorrhoeae RNA  . Hepatitis panel, acute  . HIV antibody  . RPR  . Ambulatory referral to Gastroenterology

## 2017-11-12 NOTE — Assessment & Plan Note (Signed)
Refer to colonoscopy

## 2017-11-12 NOTE — Patient Instructions (Addendum)
Try to follow the DASH guidelines (DASH stands for Dietary Approaches to Stop Hypertension). Try to limit the sodium in your diet to no more than 1,500mg  of sodium per day. Certainly try to not exceed 2,000 mg per day at the very most. Do not add salt when cooking or at the table.  Check the sodium amount on labels when shopping, and choose items lower in sodium when given a choice. Avoid or limit foods that already contain a lot of sodium. Eat a diet rich in fruits and vegetables and whole grains, and try to lose weight if overweight or obese   DASH Eating Plan DASH stands for "Dietary Approaches to Stop Hypertension." The DASH eating plan is a healthy eating plan that has been shown to reduce high blood pressure (hypertension). It may also reduce your risk for type 2 diabetes, heart disease, and stroke. The DASH eating plan may also help with weight loss. What are tips for following this plan? General guidelines  Avoid eating more than 2,300 mg (milligrams) of salt (sodium) a day. If you have hypertension, you may need to reduce your sodium intake to 1,500 mg a day.  Limit alcohol intake to no more than 1 drink a day for nonpregnant women and 2 drinks a day for men. One drink equals 12 oz of beer, 5 oz of wine, or 1 oz of hard liquor.  Work with your health care provider to maintain a healthy body weight or to lose weight. Ask what an ideal weight is for you.  Get at least 30 minutes of exercise that causes your heart to beat faster (aerobic exercise) most days of the week. Activities may include walking, swimming, or biking.  Work with your health care provider or diet and nutrition specialist (dietitian) to adjust your eating plan to your individual calorie needs. Reading food labels  Check food labels for the amount of sodium per serving. Choose foods with less than 5 percent of the Daily Value of sodium. Generally, foods with less than 300 mg of sodium per serving fit into this eating  plan.  To find whole grains, look for the word "whole" as the first word in the ingredient list. Shopping  Buy products labeled as "low-sodium" or "no salt added."  Buy fresh foods. Avoid canned foods and premade or frozen meals. Cooking  Avoid adding salt when cooking. Use salt-free seasonings or herbs instead of table salt or sea salt. Check with your health care provider or pharmacist before using salt substitutes.  Do not fry foods. Cook foods using healthy methods such as baking, boiling, grilling, and broiling instead.  Cook with heart-healthy oils, such as olive, canola, soybean, or sunflower oil. Meal planning   Eat a balanced diet that includes: ? 5 or more servings of fruits and vegetables each day. At each meal, try to fill half of your plate with fruits and vegetables. ? Up to 6-8 servings of whole grains each day. ? Less than 6 oz of lean meat, poultry, or fish each day. A 3-oz serving of meat is about the same size as a deck of cards. One egg equals 1 oz. ? 2 servings of low-fat dairy each day. ? A serving of nuts, seeds, or beans 5 times each week. ? Heart-healthy fats. Healthy fats called Omega-3 fatty acids are found in foods such as flaxseeds and coldwater fish, like sardines, salmon, and mackerel.  Limit how much you eat of the following: ? Canned or prepackaged foods. ? Food  that is high in trans fat, such as fried foods. ? Food that is high in saturated fat, such as fatty meat. ? Sweets, desserts, sugary drinks, and other foods with added sugar. ? Full-fat dairy products.  Do not salt foods before eating.  Try to eat at least 2 vegetarian meals each week.  Eat more home-cooked food and less restaurant, buffet, and fast food.  When eating at a restaurant, ask that your food be prepared with less salt or no salt, if possible. What foods are recommended? The items listed may not be a complete list. Talk with your dietitian about what dietary choices are best  for you. Grains Whole-grain or whole-wheat bread. Whole-grain or whole-wheat pasta. Brown rice. [place]. Bulgur. Whole-grain and low-sodium cereals. Pita bread. Low-fat, low-sodium crackers. Whole-wheat flour tortillas. Vegetables Fresh or frozen vegetables (raw, steamed, roasted, or grilled). Low-sodium or reduced-sodium tomato and vegetable juice. Low-sodium or reduced-sodium tomato sauce and tomato paste. Low-sodium or reduced-sodium canned vegetables. Fruits All fresh, dried, or frozen fruit. Canned fruit in natural juice (without added sugar). Meat and other protein foods Skinless chicken or [place]. Ground chicken or [place]. Pork with fat trimmed off. Fish and seafood. Egg whites. Dried beans, peas, or lentils. Unsalted nuts, nut butters, and seeds. Unsalted canned beans. Lean cuts of beef with fat trimmed off. Low-sodium, lean deli meat. Dairy Low-fat (1%) or fat-free (skim) milk. Fat-free, low-fat, or reduced-fat cheeses. Nonfat, low-sodium ricotta or cottage cheese. Low-fat or nonfat yogurt. Low-fat, low-sodium cheese. Fats and oils Soft margarine without trans fats. Vegetable oil. Low-fat, reduced-fat, or light mayonnaise and salad dressings (reduced-sodium). Canola, safflower, olive, soybean, and sunflower oils. Avocado. Seasoning and other foods Herbs. Spices. Seasoning mixes without salt. Unsalted popcorn and pretzels. Fat-free sweets. What foods are not recommended? The items listed may not be a complete list. Talk with your dietitian about what dietary choices are best for you. Grains Baked goods made with fat, such as croissants, muffins, or some breads. Dry pasta or rice meal packs. Vegetables Creamed or fried vegetables. Vegetables in a cheese sauce. Regular canned vegetables (not low-sodium or reduced-sodium). Regular canned tomato sauce and paste (not low-sodium or reduced-sodium). Regular tomato and vegetable juice (not low-sodium or reduced-sodium). Angie Fava.  Olives. Fruits Canned fruit in a light or heavy syrup. Fried fruit. Fruit in cream or butter sauce. Meat and other protein foods Fatty cuts of meat. Ribs. Fried meat. Berniece Salines. Sausage. Bologna and other processed lunch meats. Salami. Fatback. Hotdogs. Bratwurst. Salted nuts and seeds. Canned beans with added salt. Canned or smoked fish. Whole eggs or egg yolks. Chicken or [place] with skin. Dairy Whole or 2% milk, cream, and half-and-half. Whole or full-fat cream cheese. Whole-fat or sweetened yogurt. Full-fat cheese. Nondairy creamers. Whipped toppings. Processed cheese and cheese spreads. Fats and oils Butter. Stick margarine. Lard. Shortening. Ghee. Bacon fat. Tropical oils, such as coconut, palm kernel, or palm oil. Seasoning and other foods Salted popcorn and pretzels. Onion salt, garlic salt, seasoned salt, table salt, and sea salt. Worcestershire sauce. Tartar sauce. Barbecue sauce. Teriyaki sauce. Soy sauce, including reduced-sodium. Steak sauce. Canned and packaged gravies. Fish sauce. Oyster sauce. Cocktail sauce. Horseradish that you find on the shelf. Ketchup. Mustard. Meat flavorings and tenderizers. Bouillon cubes. Hot sauce and Tabasco sauce. Premade or packaged marinades. Premade or packaged taco seasonings. Relishes. Regular salad dressings. Where to find more information:  National Heart, Lung, and [place]: https://wilson-eaton.com/  American Heart Association: www.heart.org Summary  The DASH eating plan is a  healthy eating plan that has been shown to reduce high blood pressure (hypertension). It may also reduce your risk for type 2 diabetes, heart disease, and stroke.  With the DASH eating plan, you should limit salt (sodium) intake to 2,300 mg a day. If you have hypertension, you may need to reduce your sodium intake to 1,500 mg a day.  When on the DASH eating plan, aim to eat more fresh fruits and vegetables, whole grains, lean proteins, low-fat dairy, and heart-healthy  fats.  Work with your health care provider or diet and nutrition specialist (dietitian) to adjust your eating plan to your individual calorie needs. This information is not intended to replace advice given to you by your health care provider. Make sure you discuss any questions you have with your health care provider. Document Released: 08/14/2011 Document Revised: 08/18/2016 Document Reviewed: 08/18/2016 Elsevier Interactive Patient Education  2018 [employer].  Health Maintenance, Male A healthy lifestyle and preventive care is important for your health and wellness. Ask your health care provider about what schedule of regular examinations is right for you. What should I know about weight and diet? Eat a Healthy Diet  Eat plenty of vegetables, fruits, whole grains, low-fat dairy products, and lean protein.  Do not eat a lot of foods high in solid fats, added sugars, or salt.  Maintain a Healthy Weight Regular exercise can help you achieve or maintain a healthy weight. You should:  Do at least 150 minutes of exercise each week. The exercise should increase your heart rate and make you sweat (moderate-intensity exercise).  Do strength-training exercises at least twice a week.  Watch Your Levels of Cholesterol and Blood Lipids  Have your blood tested for lipids and cholesterol every 5 years starting at 47 years of age. If you are at high risk for heart disease, you should start having your blood tested when you are 47 years old. You may need to have your cholesterol levels checked more often if: ? Your lipid or cholesterol levels are high. ? You are older than 47 years of age. ? You are at high risk for heart disease.  What should I know about cancer screening? Many types of cancers can be detected early and may often be prevented. Lung Cancer  You should be screened every year for lung cancer if: ? You are a current smoker who has smoked for at least 30 years. ? You are a former  smoker who has quit within the past 15 years.  Talk to your health care provider about your screening options, when you should start screening, and how often you should be screened.  Colorectal Cancer  Routine colorectal cancer screening usually begins at 47 years of age and should be repeated every 5-10 years until you are 47 years old. You may need to be screened more often if early forms of precancerous polyps or small growths are found. Your health care provider may recommend screening at an earlier age if you have risk factors for colon cancer.  Your health care provider may recommend using home test kits to check for hidden blood in the stool.  A small camera at the end of a tube can be used to examine your colon (sigmoidoscopy or colonoscopy). This checks for the earliest forms of colorectal cancer.  Prostate and Testicular Cancer  Depending on your age and overall health, your health care provider may do certain tests to screen for prostate and testicular cancer.  Talk to your health care  provider about any symptoms or concerns you have about testicular or prostate cancer.  Skin Cancer  Check your skin from head to toe regularly.  Tell your health care provider about any new moles or changes in moles, especially if: ? There is a change in a mole's size, shape, or color. ? You have a mole that is larger than a pencil eraser.  Always use sunscreen. Apply sunscreen liberally and repeat throughout the day.  Protect yourself by wearing long sleeves, pants, a wide-brimmed hat, and sunglasses when outside.  What should I know about heart disease, diabetes, and high blood pressure?  If you are 72-69 years of age, have your blood pressure checked every 3-5 years. If you are 39 years of age or older, have your blood pressure checked every year. You should have your blood pressure measured twice-once when you are at a hospital or clinic, and once when you are not at a hospital or clinic.  Record the average of the two measurements. To check your blood pressure when you are not at a hospital or clinic, you can use: ? An automated blood pressure machine at a pharmacy. ? A home blood pressure monitor.  Talk to your health care provider about your target blood pressure.  If you are between 64-24 years old, ask your health care provider if you should take aspirin to prevent heart disease.  Have regular diabetes screenings by checking your fasting blood sugar level. ? If you are at a normal weight and have a low risk for diabetes, have this test once every three years after the age of 75. ? If you are overweight and have a high risk for diabetes, consider being tested at a younger age or more often.  A one-time screening for abdominal aortic aneurysm (AAA) by ultrasound is recommended for men aged 65-75 years who are current or former smokers. What should I know about preventing infection? Hepatitis B If you have a higher risk for hepatitis B, you should be screened for this virus. Talk with your health care provider to find out if you are at risk for hepatitis B infection. Hepatitis C Blood testing is recommended for:  Everyone born from [birth year] through 1965.  Anyone with known risk factors for hepatitis C.  Sexually Transmitted Diseases (STDs)  You should be screened each year for STDs including gonorrhea and chlamydia if: ? You are sexually active and are younger than 47 years of age. ? You are older than 47 years of age and your health care provider tells you that you are at risk for this type of infection. ? Your sexual activity has changed since you were last screened and you are at an increased risk for chlamydia or gonorrhea. Ask your health care provider if you are at risk.  Talk with your health care provider about whether you are at high risk of being infected with HIV. Your health care provider may recommend a prescription medicine to help prevent HIV  infection.  What else can I do?  Schedule regular health, dental, and eye exams.  Stay current with your vaccines (immunizations).  Do not use any tobacco products, such as cigarettes, chewing tobacco, and e-cigarettes. If you need help quitting, ask your health care provider.  Limit alcohol intake to no more than 2 drinks per day. One drink equals 12 ounces of beer, 5 ounces of wine, or 1 ounces of hard liquor.  Do not use street drugs.  Do not share needles.  Ask  your health care provider for help if you need support or information about quitting drugs.  Tell your health care provider if you often feel depressed.  Tell your health care provider if you have ever been abused or do not feel safe at home. This information is not intended to replace advice given to you by your health care provider. Make sure you discuss any questions you have with your health care provider. Document Released: 02/21/2008 Document Revised: 04/23/2016 Document Reviewed: 05/29/2015 Elsevier Interactive Patient Education  2018 [employer]  Hemorrhoids Hemorrhoids are swollen veins in and around the rectum or anus. Hemorrhoids can cause pain, itching, or bleeding. Most of the time, they do not cause serious problems. They usually get better with diet changes, lifestyle changes, and other home treatments. Follow these instructions at home: Eating and drinking  Eat foods that have fiber, such as whole grains, beans, nuts, fruits, and vegetables. Ask your doctor about taking products that have added fiber (fibersupplements).  Drink enough fluid to keep your pee (urine) clear or pale yellow. For Pain and Swelling  Take a warm-water bath (sitz bath) for 20 minutes to ease pain. Do this 3-4 times a day.  If directed, put ice on the painful area. It may be helpful to use ice between your warm baths. ? Put ice in a plastic bag. ? Place a towel between your skin and the bag. ? Leave the ice on for 20  minutes, 2-3 times a day. General instructions  Take over-the-counter and prescription medicines only as told by your doctor. ? Medicated creams and medicines that are inserted into the anus (suppositories) may be used or applied as told.  Exercise often.  Go to the bathroom when you have the urge to poop (to have a bowel movement). Do not wait.  Avoid pushing too hard (straining) when you poop.  Keep the butt area dry and clean. Use wet toilet paper or moist paper towels.  Do not sit on the toilet for a long time. Contact a doctor if:  You have any of these: ? Pain and swelling that do not get better with treatment or medicine. ? Bleeding that will not stop. ? Trouble pooping or you cannot poop. ? Pain or swelling outside the area of the hemorrhoids. This information is not intended to replace advice given to you by your health care provider. Make sure you discuss any questions you have with your health care provider. Document Released: 06/03/2008 Document Revised: 01/31/2016 Document Reviewed: 05/09/2015 Elsevier Interactive Patient Education  [employer]2018 Elsevier Inc.

## 2017-11-13 LAB — HEPATITIS PANEL, ACUTE
Hep A IgM: NONREACTIVE
Hep B C IgM: NONREACTIVE
Hepatitis B Surface Ag: NONREACTIVE
Hepatitis C Ab: NONREACTIVE
SIGNAL TO CUT-OFF: 0.07 (ref <1.00)

## 2017-11-13 LAB — TRICHOMONAS VAGINALIS RNA, QL,MALES: Trichomonas vaginalis RNA: NOT DETECTED

## 2017-11-13 LAB — HIV ANTIBODY (ROUTINE TESTING W REFLEX): HIV 1&2 Ab, 4th Generation: NONREACTIVE

## 2017-11-13 LAB — C. TRACHOMATIS/N. GONORRHOEAE RNA
C. trachomatis RNA, TMA: NOT DETECTED
N. gonorrhoeae RNA, TMA: NOT DETECTED

## 2017-11-13 LAB — RPR: RPR Ser Ql: NONREACTIVE

## 2017-11-25 ENCOUNTER — Encounter: Payer: Self-pay | Admitting: *Deleted

## 2018-02-17 ENCOUNTER — Other Ambulatory Visit: Payer: Self-pay | Admitting: Family Medicine

## 2018-05-28 ENCOUNTER — Ambulatory Visit: Payer: Self-pay

## 2018-05-28 DIAGNOSIS — Z79899 Other long term (current) drug therapy: Secondary | ICD-10-CM | POA: Diagnosis not present

## 2018-05-28 DIAGNOSIS — M25512 Pain in left shoulder: Secondary | ICD-10-CM | POA: Diagnosis not present

## 2018-05-28 DIAGNOSIS — I160 Hypertensive urgency: Secondary | ICD-10-CM | POA: Diagnosis not present

## 2018-05-28 DIAGNOSIS — I161 Hypertensive emergency: Secondary | ICD-10-CM | POA: Diagnosis not present

## 2018-05-28 DIAGNOSIS — R9431 Abnormal electrocardiogram [ECG] [EKG]: Secondary | ICD-10-CM | POA: Insufficient documentation

## 2018-05-28 DIAGNOSIS — Z9119 Patient's noncompliance with other medical treatment and regimen: Secondary | ICD-10-CM | POA: Insufficient documentation

## 2018-05-28 DIAGNOSIS — Z91199 Patient's noncompliance with other medical treatment and regimen due to unspecified reason: Secondary | ICD-10-CM | POA: Insufficient documentation

## 2018-05-28 DIAGNOSIS — R0789 Other chest pain: Secondary | ICD-10-CM | POA: Diagnosis not present

## 2018-05-28 DIAGNOSIS — I10 Essential (primary) hypertension: Secondary | ICD-10-CM | POA: Diagnosis not present

## 2018-05-28 NOTE — Telephone Encounter (Signed)
Patient's employee health nurse called with patient having a high blood pressure, I asked the patient to get on the phone. He says "I wasn't feeling good and as I was walking to the nurse, I was dizzy. She checked my BP and it was 170/110 at 0930. I sat down and rested for 10 minutes and it was 210/120 at 0940." I asked her to check it while I was on the phone and it was 220/110 at 0950. I asked him about his BP medication, he says "I take lisinopril and I didn't take it today." I asked did he miss any other days, he denies. I asked if he has a BP cuff at home, he says "yes."  I asked does he check his BP at home, he says "no." I asked about other symptoms, he says "just dizziness and weakness." I asked is there someone that could drive him to the ED, because with the dizziness and elevated BP, this is the recommendation, he says "yes, [facility] is here [facility] Care advice given, patient verbalized understanding.   Reason for Disposition . [1] Systolic BP  >= 160 OR Diastolic >= 100 AND [2] cardiac or neurologic symptoms (e.g., chest pain, difficulty breathing, unsteady gait, blurred vision)  Answer Assessment - Initial Assessment Questions 1. BLOOD PRESSURE: "What is the blood pressure?" "Did you take at least two measurements 5 minutes apart?"    170/110, then 210/120 after resting for 10 minutes; 220/110 2. ONSET: "When did you take your blood pressure?"     0930; 0940; 0950 3. HOW: "How did you obtain the blood pressure?" (e.g., visiting nurse, automatic home BP monitor)     Manual cuff with Health nurse at work 4. HISTORY: "Do you have a history of high blood pressure?"     Yes 5. MEDICATIONS: "Are you taking any medications for blood pressure?" "Have you missed any doses recently?"     Yes; yes missed today 6. OTHER SYMPTOMS: "Do you have any symptoms?" (e.g., headache, chest pain, blurred vision, difficulty breathing, weakness)     Dizziness,  weakness 7. PREGNANCY: "Is there any chance you are pregnant?" "When was your last menstrual period?"     N/A  Protocols used: HIGH BLOOD PRESSURE-A-AH

## 2018-05-28 NOTE — Telephone Encounter (Signed)
Documentation reviewed 

## 2018-05-28 NOTE — Telephone Encounter (Signed)
FYI

## 2018-05-31 ENCOUNTER — Encounter: Payer: Self-pay | Admitting: Family Medicine

## 2018-05-31 ENCOUNTER — Ambulatory Visit: Payer: BLUE CROSS/BLUE SHIELD | Admitting: Family Medicine

## 2018-05-31 VITALS — BP 160/86 | HR 65 | Temp 98.3°F | Resp 18 | Ht 66.0 in | Wt 179.5 lb

## 2018-05-31 DIAGNOSIS — R9431 Abnormal electrocardiogram [ECG] [EKG]: Secondary | ICD-10-CM

## 2018-05-31 DIAGNOSIS — R079 Chest pain, unspecified: Secondary | ICD-10-CM

## 2018-05-31 DIAGNOSIS — I10 Essential (primary) hypertension: Secondary | ICD-10-CM

## 2018-05-31 DIAGNOSIS — R140 Abdominal distension (gaseous): Secondary | ICD-10-CM | POA: Diagnosis not present

## 2018-05-31 MED ORDER — HYDROCHLOROTHIAZIDE 12.5 MG PO TABS
12.5000 mg | ORAL_TABLET | Freq: Every day | ORAL | 1 refills | Status: DC
Start: 2018-05-31 — End: 2019-01-06

## 2018-05-31 NOTE — Patient Instructions (Addendum)
Lisinopril - Take an additional tablet today.  START taking HCTZ (Hydrochlorothiazide) 12.5mg  (1 tablet) once daily.  Starting tomorrow take 2 tablets of your 10mg  Lisinopril and 1 tablet of HCTZ.  Please see Dr. Juliann Paresallwood at 11:15am on 06/02/2018 [facility]

## 2018-05-31 NOTE — Progress Notes (Signed)
Name: Alan Mathews   MRN: [id number]    DOB: 02-11-1971   Date:05/31/2018       Progress Note  Subjective  Chief Complaint  Chief Complaint  Patient presents with  . Follow-up    seen in ER for elevated BP  . Stress    working over 60 hours each week    HPI  PT presents for ER follow up. On 05/28/2018 he started having tingling in the LEFT arm, felt very stressed at the time - was at work and saw the nurse who checked his BP and told him it was too high to return to work.  She sent him to the ER (he went by private vehicle).  At the ER ([facility]) - EKG showed T-wave inversion, had 1 negative troponin level, CBC, CMP, UA, UDS were all normal, CXR normal.  It was recommended by both ER provider and Cardiology that he stay for 24-hour Obs, but he left AMA.  States he left because he was hungry and had a lot of stuff he had to do, and he did not have time to stay.  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  He denies current active chest pain.  Last episode was last week on 05/26/2018 - 05/28/2018 and the pain was on the left side, under the axilla.  He does note occasional pain - says "no serious pain".  Denies shortness of breath, no confusion, no extremity weakness, headache, slurred speech, facial droop  Abdominal bloating - started aroudn the same time as his Chest pain and HTN crisis.  Denies abdominal pain, blood in stool, dark and tarry stools, diarrhea, constipation, nausea, or vomiting.   Patient Active Problem List   Diagnosis Date Noted  . Abnormal ECG 05/28/2018  . Non-adherence to medical treatment 05/28/2018  . Colon cancer screening 11/12/2017  . Blood in the stool 11/12/2017  . Preventative health care 11/12/2017  . Vitamin D deficiency 06/09/2017  . Annual physical exam 08/08/2016  . GERD (gastroesophageal reflux disease) 06/30/2016  . Essential hypertension 04/03/2015  . Allergic  rhinitis 02/19/2015  . Anxiety 02/19/2015  . Lactose intolerance 02/19/2015  . Obstructive apnea 02/19/2015    Past Surgical History:  Procedure Laterality Date  . TONSILLECTOMY AND ADENOIDECTOMY  04/03/2010    Family History  Problem Relation Age of Onset  . Hypertension Mother   . Hypertension Father   . Hypertension Brother   . Aneurysm Maternal Grandmother     Social History   Socioeconomic History  . Marital status: Single    Spouse name: Not on file  . Number of children: Not on file  . Years of education: Not on file  . Highest education level: Not on file  Occupational History  . Not on file  Social Needs  . Financial resource strain: Not on file  . Food insecurity:    Worry: Not on file    Inability: Not on file  . Transportation needs:    Medical: Not on file    Non-medical: Not on file  Tobacco Use  . Smoking status: Never Smoker  . Smokeless tobacco: Never Used  Substance and Sexual Activity  . Alcohol use: No  . Drug use: No  . Sexual activity: Yes    Birth control/protection: None  Lifestyle  . Physical activity:    Days per week: Not on file  Minutes per session: Not on file  . Stress: Not on file  Relationships  . Social connections:    Talks on phone: Not on file    Gets together: Not on file    Attends religious service: Not on file    Active member of club or organization: Not on file    Attends meetings of clubs or organizations: Not on file    Relationship status: Not on file  . Intimate partner violence:    Fear of current or ex partner: Not on file    Emotionally abused: Not on file    Physically abused: Not on file    Forced sexual activity: Not on file  Other Topics Concern  . Not on file  Social History Narrative  . Not on file     Current Outpatient Medications:  .  hydrochlorothiazide (HYDRODIURIL) 12.5 MG tablet, Take 1 tablet (12.5 mg total) by mouth daily., Disp: 30 tablet, Rfl: 1  No Known Allergies  I  personally reviewed active problem list, medication list, allergies, notes from last encounter, lab results with the patient/caregiver today.  ROS Ten systems reviewed and is negative except as mentioned in HPI  Objective  Vitals:   05/31/18 1133  BP: (!) 160/86  Pulse: 65  Resp: 18  Temp: 98.3 F (36.8 C)  TempSrc: Oral  SpO2: 99%  Weight: 179 lb 8 oz (81.4 kg)  Height: 5\' 6"  (1.676 m)   Body mass index is 28.97 kg/m.  Physical Exam Constitutional: Patient appears well-developed and well-nourished. No distress.  HENT: Head: Normocephalic and atraumatic.  Eyes: Conjunctivae and EOM are normal. No scleral icterus.  Pupils are equal, round, and reactive to light.  Neck: Normal range of motion. Neck supple. No JVD present. No thyromegaly present.  Cardiovascular: Normal rate, regular rhythm and normal heart sounds.  No murmur heard. No BLE edema. Pulmonary/Chest: Effort normal and breath sounds normal. No respiratory distress. Abdominal: Soft. Bowel sounds are normal, no distension. There is no tenderness. No masses. Neurological: Pt is alert and oriented to person, place, and time. No cranial nerve deficit. Coordination, balance, strength, speech and gait are normal.  Skin: Skin is warm and dry. No rash noted. No erythema.  Psychiatric: Patient has a normal mood and affect. behavior is normal. Judgment and thought content normal.  No results found for this or any previous visit (from the past 72 hour(s)).  PHQ2/9: Depression screen [facility] 2/9 05/31/2018 11/12/2017 09/28/2017 08/08/2016 12/11/2015  Decreased Interest 0 0 0 0 0  Down, Depressed, Hopeless 0 0 0 0 0  PHQ - 2 Score 0 0 0 0 0  Altered sleeping 0 - - - -  Tired, decreased energy 3 - - - -  Change in appetite 0 - - - -  Feeling bad or failure about yourself  0 - - - -  Trouble concentrating 0 - - - -  Moving slowly or fidgety/restless 0 - - - -  Suicidal thoughts 0 - - - -  PHQ-9 Score 3 - - - -  Difficult doing  work/chores Not difficult at all - - - -    Fall Risk: Fall Risk  05/31/2018 11/12/2017 09/28/2017 08/08/2016 12/11/2015  Falls in the past year? No No No No No   Assessment & Plan  1. Essential hypertension - Ambulatory referral to Cardiology - STAT referral placed; to see Dr. Juliann Paresallwood Wednesday 06/02/2018 at 11:15am.  An earlier appointment of 06/01/2018 is offered, but pt declines and says "  I have to work". - hydrochlorothiazide (HYDRODIURIL) 12.5 MG tablet; Take 1 tablet (12.5 mg total) by mouth daily.  Dispense: 30 tablet; Refill: 1 - Lipid panel - CRP High sensitivity  2. Abnormal EKG - Ambulatory referral to Cardiology - Lipid panel - CRP High sensitivity  3. Chest pain, unspecified type - Ambulatory referral to Cardiology - Lipid panel - CRP High sensitivity  4. Abdominal bloating - Advised we will monitor for now, recommend he attend Cardiology appt first, then follow up if bloating is not improving.  Advised to eat a well balanced diet, recommend stress reduction.  Abdomen is non-tender, soft, and non-distended, and he is otherwise asymptomatic.

## 2018-06-01 ENCOUNTER — Other Ambulatory Visit: Payer: Self-pay | Admitting: Family Medicine

## 2018-06-01 DIAGNOSIS — E785 Hyperlipidemia, unspecified: Secondary | ICD-10-CM

## 2018-06-01 DIAGNOSIS — R9431 Abnormal electrocardiogram [ECG] [EKG]: Secondary | ICD-10-CM

## 2018-06-01 DIAGNOSIS — I10 Essential (primary) hypertension: Secondary | ICD-10-CM

## 2018-06-01 LAB — LIPID PANEL
Cholesterol: 169 mg/dL (ref <200)
HDL: 52 mg/dL (ref >40)
LDL Cholesterol (Calc): 98 mg/dL (calc)
Non-HDL Cholesterol (Calc): 117 mg/dL (calc) (ref <130)
Total CHOL/HDL Ratio: 3.3 (calc) (ref <5.0)
Triglycerides: 101 mg/dL (ref <150)

## 2018-06-01 LAB — HIGH SENSITIVITY CRP: hs-CRP: 1.6 mg/L

## 2018-06-01 MED ORDER — ATORVASTATIN CALCIUM 40 MG PO TABS
40.0000 mg | ORAL_TABLET | Freq: Every day | ORAL | 3 refills | Status: DC
Start: 2018-06-01 — End: 2020-08-23

## 2018-06-02 DIAGNOSIS — R9431 Abnormal electrocardiogram [ECG] [EKG]: Secondary | ICD-10-CM | POA: Diagnosis not present

## 2018-06-02 DIAGNOSIS — I208 Other forms of angina pectoris: Secondary | ICD-10-CM | POA: Diagnosis not present

## 2018-06-02 DIAGNOSIS — I10 Essential (primary) hypertension: Secondary | ICD-10-CM | POA: Diagnosis not present

## 2018-06-02 DIAGNOSIS — R0609 Other forms of dyspnea: Secondary | ICD-10-CM | POA: Diagnosis not present

## 2018-06-28 ENCOUNTER — Other Ambulatory Visit: Payer: Self-pay | Admitting: Family Medicine

## 2018-06-28 DIAGNOSIS — I10 Essential (primary) hypertension: Secondary | ICD-10-CM

## 2018-06-28 NOTE — Telephone Encounter (Signed)
There is an error message when I go to look at Dr. Glennis Brink note.  Please call their office to determine if pt was instructed to remain on Lisinopril 20mg , or if there was a dose change.

## 2018-06-29 NOTE — Telephone Encounter (Signed)
Left message with receptionist at [facility]. She stated that she would give it to Dr. Alfredo Batty nurse and call the office back with verification on the Lisinopril.

## 2018-06-30 NOTE — Telephone Encounter (Signed)
Toris calling from [facility] to go over medication list with Cala Bradford. Please advise. Would like a call back tomorrow. CB#: [phone]

## 2018-07-01 NOTE — Telephone Encounter (Signed)
Per Toris at [place], Dr. Juliann Pares would like patient to be on 10mg  of the Lisinopril.

## 2019-01-06 ENCOUNTER — Encounter: Payer: Self-pay | Admitting: Family Medicine

## 2019-01-06 ENCOUNTER — Ambulatory Visit (INDEPENDENT_AMBULATORY_CARE_PROVIDER_SITE_OTHER): Payer: BLUE CROSS/BLUE SHIELD | Admitting: Family Medicine

## 2019-01-06 ENCOUNTER — Other Ambulatory Visit: Payer: Self-pay

## 2019-01-06 ENCOUNTER — Encounter: Payer: Self-pay | Admitting: Emergency Medicine

## 2019-01-06 VITALS — BP 128/84 | HR 73 | Temp 98.1°F | Resp 16 | Ht 66.0 in | Wt 183.4 lb

## 2019-01-06 DIAGNOSIS — E785 Hyperlipidemia, unspecified: Secondary | ICD-10-CM | POA: Diagnosis not present

## 2019-01-06 DIAGNOSIS — I10 Essential (primary) hypertension: Secondary | ICD-10-CM

## 2019-01-06 DIAGNOSIS — L989 Disorder of the skin and subcutaneous tissue, unspecified: Secondary | ICD-10-CM | POA: Diagnosis not present

## 2019-01-06 MED ORDER — LISINOPRIL 10 MG PO TABS: 10.0000 mg | ORAL_TABLET | Freq: Every day | ORAL | 1 refills | Status: DC

## 2019-01-06 MED ORDER — MUPIROCIN 2 % EX OINT: TOPICAL_OINTMENT | CUTANEOUS | 0 refills | Status: DC

## 2019-01-06 NOTE — Progress Notes (Signed)
Acute Office Visit  Subjective:    Patient ID: Alan Mathews, male    DOB: 1971-03-01, 48 y.o.   MRN: [id number]  Chief Complaint  Patient presents with  . Cyst    knot on top lip for 2 months    HPI Patient is in today for cyst on LEFT upper lip:  Has been present for 2 months and is non-tender. He has tried to pop it several times without being able to do so.  Denies fevers, chills, prior lesions, or tenderness to the area.  HLD: Not taking atorvastatin.  Discussed increased risk of MI/CVA and he agrees to restart the medication.  Denies chest pain, shortness of breath or myalgias.  HTN: Doing well on lisinopril only, not taking HCTZ any more.  He is checking BP's at home - have been within normal range. Denies chest pain, shortness of breath, or myalgias.  The 10-year ASCVD risk score Denman George DC Montez Hageman., et al., 2013) is: 7.5%   Values used to calculate the score:     Age: 75 years     Sex: Male     Is Non-Hispanic African American: Yes     Diabetic: No     Tobacco smoker: No     Systolic Blood Pressure: 128 mmHg     Is BP treated: Yes     HDL Cholesterol: 52 mg/dL     Total Cholesterol: 169 mg/dL   Past Medical History:  Diagnosis Date  . Hypertension     Past Surgical History:  Procedure Laterality Date  . TONSILLECTOMY AND ADENOIDECTOMY  04/03/2010    Family History  Problem Relation Age of Onset  . Hypertension Mother   . Hypertension Father   . Hypertension Brother   . Aneurysm Maternal Grandmother     Social History   Socioeconomic History  . Marital status: Single    Spouse name: Not on file  . Number of children: Not on file  . Years of education: Not on file  . Highest education level: Not on file  Occupational History  . Not on file  Social Needs  . Financial resource strain: Not on file  . Food insecurity:    Worry: Not on file    Inability: Not on file  . Transportation needs:    Medical: Not on file    Non-medical: Not on file  Tobacco  Use  . Smoking status: Never Smoker  . Smokeless tobacco: Never Used  Substance and Sexual Activity  . Alcohol use: No  . Drug use: No  . Sexual activity: Yes    Birth control/protection: None  Lifestyle  . Physical activity:    Days per week: Not on file    Minutes per session: Not on file  . Stress: Not on file  Relationships  . Social connections:    Talks on phone: Not on file    Gets together: Not on file    Attends religious service: Not on file    Active member of club or organization: Not on file    Attends meetings of clubs or organizations: Not on file    Relationship status: Not on file  . Intimate partner violence:    Fear of current or ex partner: Not on file    Emotionally abused: Not on file    Physically abused: Not on file    Forced sexual activity: Not on file  Other Topics Concern  . Not on file  Social History Narrative  .  Not on file    Outpatient Medications Prior to Visit  Medication Sig Dispense Refill  . atorvastatin (LIPITOR) 40 MG tablet Take 1 tablet (40 mg total) by mouth daily. 90 tablet 3  . lisinopril (PRINIVIL,ZESTRIL) 10 MG tablet TAKE 1 TABLET BY MOUTH EVERY DAY 90 tablet 1  . hydrochlorothiazide (HYDRODIURIL) 12.5 MG tablet Take 1 tablet (12.5 mg total) by mouth daily. 30 tablet 1   No facility-administered medications prior to visit.     No Known Allergies  ROS Constitutional: Negative for fever or weight change.  Respiratory: Negative for cough and shortness of breath.   Cardiovascular: Negative for chest pain or palpitations.  Gastrointestinal: Negative for abdominal pain, no bowel changes.  Musculoskeletal: Negative for gait problem or joint swelling.  Skin: Negative for rash. See HPI Neurological: Negative for dizziness or headache.  No other specific complaints in a complete review of systems (except as listed in HPI above).    Objective:    Physical Exam   Constitutional: Patient appears well-developed and  well-nourished. No distress.  HENT: Head: Normocephalic and atraumatic.  Neck: Normal range of motion. Neck supple. No JVD present. No thyromegaly present.  Cardiovascular: Normal rate, regular rhythm and normal heart sounds.  No murmur heard. No BLE edema. Pulmonary/Chest: Effort normal and breath sounds normal. No respiratory distress. Neurological: Pt is alert and oriented to person, place, and time. No cranial nerve deficit. Coordination, balance, strength, speech and gait are normal.  Skin: Skin is warm and dry. No rash noted. No erythema. Very small cystic lesion (smaller than a pencil eraser) that is non-tender with black center, flesh colored. Psychiatric: Patient has a normal mood and affect. behavior is normal. Judgment and thought content normal.  BP 128/84 (BP Location: Right Arm, Patient Position: Sitting, Cuff Size: Large)   Pulse 73   Temp 98.1 F (36.7 C) (Oral)   Resp 16   Ht  (1.676 m)   Wt 183 lb 6.4 oz (83.2 kg)   SpO2 99%   BMI 29.60 kg/m  Wt Readings from Last 3 Encounters:  01/06/19 183 lb 6.4 oz (83.2 kg)  05/31/18 179 lb 8 oz (81.4 kg)  11/12/17 172 lb 6.4 oz (78.2 kg)   There are no preventive care reminders to display for this patient.  There are no preventive care reminders to display for this patient.  Lab Results  Component Value Date   TSH 1.29 06/08/2017   Lab Results  Component Value Date   WBC 5.6 06/08/2017   HGB 14.9 06/08/2017   HCT 43.9 06/08/2017   MCV 84.1 06/08/2017   PLT 236 06/08/2017   Lab Results  Component Value Date   NA 142 10/12/2017   K 4.4 10/12/2017   CO2 26 10/12/2017   GLUCOSE 94 10/12/2017   BUN 14 10/12/2017   CREATININE 0.99 10/12/2017   BILITOT 0.3 06/08/2017   ALKPHOS 74 08/08/2016   AST 15 06/08/2017   ALT 15 06/08/2017   PROT 6.5 06/08/2017   ALBUMIN 4.2 08/08/2016   CALCIUM 9.4 10/12/2017   ANIONGAP 5 08/06/2015   Lab Results  Component Value Date   CHOL 169 05/31/2018   Lab Results   Component Value Date   HDL 52 05/31/2018   Lab Results  Component Value Date   LDLCALC 98 05/31/2018   Lab Results  Component Value Date   TRIG 101 05/31/2018   Lab Results  Component Value Date   CHOLHDL 3.3 05/31/2018   No results found  for: HGBA1C     Assessment & Plan:   Problem List Items Addressed This Visit      Cardiovascular and Mediastinum   Essential hypertension    Other Visit Diagnoses    Skin lesion    -  Primary   Relevant Medications   mupirocin ointment (BACTROBAN) 2 %   Hyperlipidemia, unspecified hyperlipidemia type         Meds ordered this encounter  Medications  . mupirocin ointment (BACTROBAN) 2 %    Sig: Apply twice daily to affected lesion x14 days    Dispense:  22 g    Refill:  0    Order Specific Question:   Supervising Provider    Answer:   Alba CorySOWLES, KRICHNA [[id number]]   Skin lesion - will call me in 1-2 weeks if still present and will refer to dermatology for excision and biopsy.  Doren CustardEmily E Boyce, FNP

## 2019-07-08 ENCOUNTER — Encounter: Payer: BLUE CROSS/BLUE SHIELD | Admitting: Family Medicine

## 2019-07-19 ENCOUNTER — Encounter: Payer: Self-pay | Admitting: Family Medicine

## 2019-07-19 ENCOUNTER — Ambulatory Visit: Payer: Self-pay | Admitting: Family Medicine

## 2019-07-19 ENCOUNTER — Other Ambulatory Visit: Payer: Self-pay

## 2019-07-19 VITALS — BP 130/52 | HR 78 | Temp 97.5°F | Resp 14 | Ht 66.0 in | Wt 188.6 lb

## 2019-07-19 DIAGNOSIS — H1132 Conjunctival hemorrhage, left eye: Secondary | ICD-10-CM

## 2019-07-19 NOTE — Patient Instructions (Signed)
Subconjunctival Hemorrhage Subconjunctival hemorrhage is bleeding that happens between the white part of your eye (sclera) and the clear membrane that covers the outside of your eye (conjunctiva). There are many tiny blood vessels near the surface of your eye. A subconjunctival hemorrhage happens when one or more of these vessels breaks and bleeds, causing a red patch to appear on your eye. This is similar to a bruise. Depending on the amount of bleeding, the red patch may only cover a small area of your eye or it may cover the entire visible part of the sclera. If a lot of blood collects under the conjunctiva, there may also be swelling. Subconjunctival hemorrhages do not affect your vision or cause pain, but your eye may feel irritated if there is swelling. Subconjunctival hemorrhages usually do not require treatment, and they usually disappear on their own within two weeks. What are the causes? This condition may be caused by:  Mild trauma, such as rubbing your eye too hard.  Blunt injuries, such as from playing sports or having contact with a deployed airbag.  Coughing, sneezing, or vomiting.  Straining, such as when lifting a heavy object.  High blood pressure.  Recent eye surgery.  Diabetes.  Certain medicines, especially blood thinners (anticoagulants).  Other conditions, such as eye tumors, bleeding disorders, or blood vessel abnormalities. Subconjunctival hemorrhages can also happen without an obvious cause. What are the signs or symptoms? Symptoms of this condition include:  A bright red or dark red patch on the white part of the eye. The red area may: ? Spread out to cover a larger area of the eye before it goes away. ? Turn brownish-yellow before it goes away.  Swelling around the eye.  Mild eye irritation. How is this diagnosed? This condition is diagnosed with a physical exam. If your subconjunctival hemorrhage was caused by trauma, your health care provider may refer  you to an eye specialist (ophthalmologist) or another specialist to check for other injuries. You may have other tests, including:  An eye exam.  A blood pressure check.  Blood tests to check for bleeding disorders. If your subconjunctival hemorrhage was caused by trauma, X-rays or a CT scan may be done to check for other injuries. How is this treated? Usually, treatment is not needed for this condition. If you have discomfort, your health care provider may recommend eye drops or cold compresses. Follow these instructions at home:  Take over-the-counter and prescription medicines only as directed by your health care provider.  Use eye drops or cold compresses to help with discomfort as directed by your health care provider.  Avoid activities, things, and environments that may irritate or injure your eye.  Keep all follow-up visits as told by your health care provider. This is important. Contact a health care provider if:  You have pain in your eye.  The bleeding does not go away within 3 weeks.  You keep getting new subconjunctival hemorrhages. Get help right away if:  Your vision changes or you have difficulty seeing.  You suddenly develop severe sensitivity to light.  You develop a severe headache, persistent vomiting, confusion, or abnormal tiredness (lethargy).  Your eye seems to bulge or protrude from your eye socket.  You develop unexplained bruises on your body.  You have unexplained bleeding in another area of your body. Summary  Subconjunctival hemorrhage is bleeding that happens between the white part of your eye and the clear membrane that covers the outside of your eye.  This condition   is similar to a bruise.  Subconjunctival hemorrhages usually do not require treatment, and they usually disappear on their own within two weeks.  Use eye drops or cold compresses to help with discomfort as directed by your health care provider. This information is not  intended to replace advice given to you by your health care provider. Make sure you discuss any questions you have with your health care provider. Document Released: 08/25/2005 Document Revised: 05/26/2018 Document Reviewed: 05/26/2018 Elsevier Patient Education  2020 [employer]  

## 2019-07-19 NOTE — Progress Notes (Signed)
Patient ID: Alan Mathews, male    DOB: [birth year], 48 y.o.   MRN: [id number]  PCP: Doren Custard, FNP  Chief Complaint  Patient presents with  . Red eye    left eye red, started last friday/saturday. no pain, a little itchy, no crust.    Subjective:   Alan Mathews is a 48 y.o. male, presents to clinic with CC of red eye onset about 4 days ago.  He isn't sure exactly when it started but he believes it was suddenly red.  He had a little eye itching before he noticed the redness, itching was brief, after being outside and mowing the lawn.  He had no sx them morning he noticed the redness.  He was told by coworker that his eye was red.  Redness is bright to dark red the left lateral aspect of left conjunctiva.  He had no drainage, pain, itching, vision changes.   He denies URI sx.    Patient Active Problem List   Diagnosis Date Noted  . Abnormal ECG 05/28/2018  . Non-adherence to medical treatment 05/28/2018  . Colon cancer screening 11/12/2017  . Blood in the stool 11/12/2017  . Preventative health care 11/12/2017  . Vitamin D deficiency 06/09/2017  . Annual physical exam 08/08/2016  . GERD (gastroesophageal reflux disease) 06/30/2016  . Essential hypertension 04/03/2015  . Allergic rhinitis 02/19/2015  . Anxiety 02/19/2015  . Lactose intolerance 02/19/2015  . Obstructive apnea 02/19/2015      Current Outpatient Medications:  .  lisinopril (ZESTRIL) 10 MG tablet, Take 1 tablet (10 mg total) by mouth daily., Disp: 90 tablet, Rfl: 1 .  atorvastatin (LIPITOR) 40 MG tablet, Take 1 tablet (40 mg total) by mouth daily. (Patient not taking: Reported on 07/19/2019), Disp: 90 tablet, Rfl: 3 .  mupirocin ointment (BACTROBAN) 2 %, Apply twice daily to affected lesion x14 days (Patient not taking: Reported on 07/19/2019), Disp: 22 g, Rfl: 0   No Known Allergies   Family History  Problem Relation Age of Onset  . Hypertension Mother   . Hypertension Father   . Hypertension  Brother   . Aneurysm Maternal Grandmother      Social History   Socioeconomic History  . Marital status: Single    Spouse name: Not on file  . Number of children: Not on file  . Years of education: Not on file  . Highest education level: Not on file  Occupational History  . Not on file  Social Needs  . Financial resource strain: Not on file  . Food insecurity    Worry: Not on file    Inability: Not on file  . Transportation needs    Medical: Not on file    Non-medical: Not on file  Tobacco Use  . Smoking status: Never Smoker  . Smokeless tobacco: Never Used  Substance and Sexual Activity  . Alcohol use: No  . Drug use: No  . Sexual activity: Yes    Birth control/protection: None  Lifestyle  . Physical activity    Days per week: Not on file    Minutes per session: Not on file  . Stress: Not on file  Relationships  . Social Musician on phone: Not on file    Gets together: Not on file    Attends religious service: Not on file    Active member of club or organization: Not on file    Attends meetings of clubs or organizations: Not  on file    Relationship status: Not on file  . Intimate partner violence    Fear of current or ex partner: Not on file    Emotionally abused: Not on file    Physically abused: Not on file    Forced sexual activity: Not on file  Other Topics Concern  . Not on file  Social History Narrative  . Not on file    I personally reviewed active problem list, medication list, allergies, family history, social history, health maintenance, notes from last encounter, lab results with the patient/caregiver today.  Review of Systems  Constitutional: Negative.   HENT: Negative.   Eyes: Negative.   Respiratory: Negative.   Cardiovascular: Negative.   Gastrointestinal: Negative.   Endocrine: Negative.   Genitourinary: Negative.   Musculoskeletal: Negative.   Skin: Negative.   Allergic/Immunologic: Negative.   Neurological: Negative.    Hematological: Negative.   Psychiatric/Behavioral: Negative.   All other systems reviewed and are negative.      Objective:   Vitals:   07/19/19 1138  BP: (!) 130/52  Pulse: 78  Resp: 14  Temp: (!) 97.5 F (36.4 C)  SpO2: 98%  Weight: 188 lb 9.6 oz (85.5 kg)  Height: 5\' 6"  (1.676 m)    Body mass index is 30.44 kg/m.  Physical Exam Vitals signs and nursing note reviewed.  Constitutional:      General: He is not in acute distress.    Appearance: Normal appearance. He is well-developed. He is not ill-appearing, toxic-appearing or diaphoretic.  HENT:     Head: Normocephalic and atraumatic.     Right Ear: External ear normal.     Left Ear: External ear normal.     Nose: Nose normal.  Eyes:     General: Lids are normal. Vision grossly intact. No scleral icterus.       Right eye: No discharge.        Left eye: No discharge.     Extraocular Movements: Extraocular movements intact.     Right eye: Normal extraocular motion and no nystagmus.     Left eye: Normal extraocular motion and no nystagmus.     Conjunctiva/sclera:     Right eye: Right conjunctiva is not injected. No chemosis, exudate or hemorrhage.    Left eye: Left conjunctiva is not injected. Hemorrhage present. No chemosis or exudate.    Pupils: Pupils are equal, round, and reactive to light.  Neck:     Trachea: No tracheal deviation.  Cardiovascular:     Rate and Rhythm: Normal rate and regular rhythm.  Pulmonary:     Effort: Pulmonary effort is normal. No respiratory distress.     Breath sounds: No stridor.  Musculoskeletal: Normal range of motion.  Skin:    General: Skin is warm and dry.     Findings: No rash.  Neurological:     Mental Status: He is alert.     Motor: No abnormal muscle tone.     Coordination: Coordination normal.  Psychiatric:        Behavior: Behavior normal.     .va  Results for orders placed or performed in visit on 05/31/18  Lipid panel  Result Value Ref Range   Cholesterol  169 <200 mg/dL   HDL 52 >40 mg/dL   Triglycerides 101 <150 mg/dL   LDL Cholesterol (Calc) 98 mg/dL (calc)   Total CHOL/HDL Ratio 3.3 <5.0 (calc)   Non-HDL Cholesterol (Calc) 117 <130 mg/dL (calc)  CRP High sensitivity  Result Value  Ref Range   hs-CRP 1.6 mg/L        Assessment & Plan:      ICD-10-CM   1. Non-traumatic subconjunctival hemorrhage of left eye  H11.32 Visual acuity screening    Educated pt about dx, reassured.  Vision and physical exam reassuring.    Encouraged to use OTC allergy eye drops - several names of eye drops written down for him, reviewed concerning sx that I would like him to follow up on, but reassured it will resolve on his own like a a bruise would.    Danelle BerryLeisa Analeah Brame, PA-C 07/19/19 12:01 PM

## 2019-08-02 ENCOUNTER — Ambulatory Visit: Payer: Self-pay | Admitting: Family Medicine

## 2019-11-11 ENCOUNTER — Other Ambulatory Visit: Payer: Self-pay | Admitting: Family Medicine

## 2019-11-11 DIAGNOSIS — I10 Essential (primary) hypertension: Secondary | ICD-10-CM

## 2019-11-11 NOTE — Telephone Encounter (Signed)
Requested medications are due for refill today?  Yes   Requested medications are on active medication list?  Yes  Last Refill:   01/06/2019 # 90 with one refill  Future visit scheduled?  No  Notes to Clinic:  Protocol failed for this medication due to patient being overdue for required labs.  Last lab work 10/12/2017.

## 2019-11-11 NOTE — Telephone Encounter (Signed)
Please contact patient and let him know he needs to schedule a follow-up visit.  His lisinopril was refilled to continue, although labs need to be obtained and a follow-up needed to continue to refill in the future.

## 2020-01-05 ENCOUNTER — Other Ambulatory Visit: Payer: Self-pay

## 2020-01-05 ENCOUNTER — Ambulatory Visit: Payer: Self-pay | Attending: Internal Medicine

## 2020-01-05 DIAGNOSIS — Z23 Encounter for immunization: Secondary | ICD-10-CM

## 2020-01-05 NOTE — Progress Notes (Signed)
   Covid-19 Vaccination Clinic  Name:  Alan Mathews    MRN: [id number] DOB: [birth year]  01/05/2020  Mr. [name] was observed post Covid-19 immunization for 15 minutes without incident. He was provided with Vaccine Information Sheet and instruction to access the V-Safe system.   Mr. [name] was instructed to call 911 with any severe reactions post vaccine: Marland Kitchen Difficulty breathing  . Swelling of face and throat  . A fast heartbeat  . A bad rash all over body  . Dizziness and weakness   Immunizations Administered    Name Date Dose VIS Date Route   Pfizer COVID-19 Vaccine 01/05/2020 10:37 AM 0.3 mL 11/02/2018 Intramuscular   Manufacturer: [employer], [employer]   Lot: [id number]   NDC: [address]-1

## 2020-01-31 ENCOUNTER — Ambulatory Visit: Payer: Self-pay | Attending: Internal Medicine

## 2020-01-31 DIAGNOSIS — Z23 Encounter for immunization: Secondary | ICD-10-CM

## 2020-01-31 NOTE — Progress Notes (Signed)
   Covid-19 Vaccination Clinic  Name:  MARCUS SCHWANDT    MRN: [id number] DOB: [birth year]  01/31/2020  Mr. Westmoreland was observed post Covid-19 immunization for 15 minutes without incident. He was provided with Vaccine Information Sheet and instruction to access the V-Safe system.   Mr. Denardo was instructed to call 911 with any severe reactions post vaccine: Marland Kitchen Difficulty breathing  . Swelling of face and throat  . A fast heartbeat  . A bad rash all over body  . Dizziness and weakness   Immunizations Administered    Name Date Dose VIS Date Route   Pfizer COVID-19 Vaccine 01/31/2020 10:29 AM 0.3 mL 11/02/2018 Intramuscular   Manufacturer: [employer], [employer]   Lot: [id number]   NDC: [address]-6

## 2020-07-24 ENCOUNTER — Ambulatory Visit: Payer: Self-pay | Admitting: Internal Medicine

## 2020-07-25 NOTE — Progress Notes (Signed)
Patient ID: Alan Mathews, male    DOB: 11/19/1970, 49 y.o.   MRN: [id number]  PCP: Doren Custard, FNP  Chief Complaint  Patient presents with  . Follow-up    Subjective:   Alan Mathews is a 49 y.o. male, presents to clinic with CC of the following:  Chief Complaint  Patient presents with  . Follow-up    HPI:  Patient is a 49 year old male Last visit at [facility] was in November 2020 Follows up today. All in all feeling good He noted at the end of the appointment he has a little lump area around his anal region that is not painful, was just asking if it is anything concerning.  Denies any pain with bowel movements, no bleeding from this area.  Hyperlipidemia: Not taking atorvastatin. Just stopped as not felt needed.  Lab Results  Component Value Date   CHOL 169 05/31/2018   HDL 52 05/31/2018   LDLCALC 98 05/31/2018   TRIG 101 05/31/2018   CHOLHDL 3.3 05/31/2018     Hypertension: Medication regimen-lisinopril - 10 mg Not checking BP's at home.  BP Readings from Last 3 Encounters:  07/26/20 (!) 170/90  07/19/19 (!) 130/52  01/06/19 128/84    Denies chest pain, palpitations, shortness of breath, increased lower extremity swelling, increased headaches or vision changes.  Notes at times he gets a little discomfort in his upper neck area and in the occipital region of his head, not marked.  Overweight Wt Readings from Last 3 Encounters:  07/26/20 185 lb 6.4 oz (84.1 kg)  07/19/19 188 lb 9.6 oz (85.5 kg)  01/06/19 183 lb 6.4 oz (83.2 kg)   Weight stable, was 195 this summer and lessened since.  He notes he would like to get closer to 175 in the near future. Only exercise is a lot of walking at work, and yard work, Not very restrictive with diet   Patient Active Problem List   Diagnosis Date Noted  . Abnormal ECG 05/28/2018  . Non-adherence to medical treatment 05/28/2018  . Colon cancer screening 11/12/2017  . Blood in the stool 11/12/2017  . Preventative  health care 11/12/2017  . Vitamin D deficiency 06/09/2017  . Annual physical exam 08/08/2016  . GERD (gastroesophageal reflux disease) 06/30/2016  . Essential hypertension 04/03/2015  . Allergic rhinitis 02/19/2015  . Anxiety 02/19/2015  . Lactose intolerance 02/19/2015  . Obstructive apnea 02/19/2015      Current Outpatient Medications:  .  atorvastatin (LIPITOR) 40 MG tablet, Take 1 tablet (40 mg total) by mouth daily., Disp: 90 tablet, Rfl: 3 .  lisinopril (ZESTRIL) 10 MG tablet, TAKE 1 TABLET BY MOUTH EVERY DAY, Disp: 90 tablet, Rfl: 0   No Known Allergies   Past Surgical History:  Procedure Laterality Date  . TONSILLECTOMY AND ADENOIDECTOMY  04/03/2010     Family History  Problem Relation Age of Onset  . Hypertension Mother   . Hypertension Father   . Hypertension Brother   . Aneurysm Maternal Grandmother      Social History   Tobacco Use  . Smoking status: Never Smoker  . Smokeless tobacco: Never Used  Substance Use Topics  . Alcohol use: No    With staff assistance, above reviewed with the patient today.  ROS: As per HPI, noted at times he can feel some bloated feelings, denies abdominal pains, lower extremity swelling.  No change in bowel habits with no black or dark stools, no bleeding per rectum.  Denies reflux  symptoms.  No dysuria or problems urinating.  Otherwise no specific complaints on a limited and focused system review   No results found for this or any previous visit (from the past 72 hour(s)).   PHQ2/9: Depression screen [facility] 2/9 07/26/2020 07/19/2019 01/06/2019 05/31/2018 11/12/2017  Decreased Interest 0 0 0 0 0  Down, Depressed, Hopeless 0 0 0 0 0  PHQ - 2 Score 0 0 0 0 0  Altered sleeping - 0 0 0 -  Tired, decreased energy - 0 0 3 -  Change in appetite - 0 0 0 -  Feeling bad or failure about yourself  - 0 0 0 -  Trouble concentrating - 0 0 0 -  Moving slowly or fidgety/restless - 0 0 0 -  Suicidal thoughts - 0 0 0 -  PHQ-9 Score - 0 0  3 -  Difficult doing work/chores - Not difficult at all Not difficult at all Not difficult at all -   PHQ-2/9 Result is neg  Fall Risk: Fall Risk  07/26/2020 07/19/2019 01/06/2019 05/31/2018 11/12/2017  Falls in the past year? 0 0 0 No No  Number falls in past yr: 0 0 0 - -  Injury with Fall? 0 0 0 - -  Follow up - - Falls evaluation completed - -      Objective:   Vitals:   07/26/20 1010 07/26/20 1014  BP: (!) 180/100 (!) 170/90  Pulse: 77   Resp: 16   Temp: 98.9 F (37.2 C)   TempSrc: Oral   SpO2: 98%   Weight: 185 lb 6.4 oz (84.1 kg)   Height: 5\' 6"  (1.676 m)     Body mass index is 29.92 kg/m. Rechecked by myself was 162/90 on the left with a large adult cuff. Physical Exam   NAD, masked, pleasant HEENT - [place]/AT, sclera anicteric, PERRL, positive glasses, EOMI, conj - non-inj'ed,  pharynx clear Neck - supple, no adenopathy, no TM, carotids 2+ and = without bruits bilat Car - RRR without m/g/r Pulm- RR and effort normal at rest, CTA without wheeze or rales Abd - soft, mildly obese, NT diffusely, ND,  Back - no CVA tenderness Rectal -a very small nodularity was present inferior to the anus, nontender to palpate, no concerning overlying skin changes, with no erythema, no wartlike entity, not hemorrhoidal. Ext - no LE edema,  Neuro/psychiatric - affect was not flat, appropriate with conversation  Alert   Grossly non-focal   Speech normal  Results for orders placed or performed in visit on 05/31/18  Lipid panel  Result Value Ref Range   Cholesterol 169 <200 mg/dL   HDL 52 06/02/18 mg/dL   Triglycerides >40 981 mg/dL   LDL Cholesterol (Calc) 98 mg/dL (calc)   Total CHOL/HDL Ratio 3.3 <5.0 (calc)   Non-HDL Cholesterol (Calc) 117 <130 mg/dL (calc)  CRP High sensitivity  Result Value Ref Range   hs-CRP 1.6 mg/L   Previous labs reviewed.    Assessment & Plan:    1. Essential hypertension Concerned that his blood pressure is not very well controlled in the recent past,  and may be contributing some to those posterior headache type symptoms he has that involve the neck area as well. We will add a hydrochlorothiazide product to the lisinopril and assess his response. Prescribe Zestoretic-10-12 0.5 daily. Asked after a few days to get some outside blood pressure readings and write them down and bring to his follow-up visit. We will see if adding the hydrochlorothiazide helps  with the bloating feelings he has as well. Did know we may need to increase that medicine a little further on his next visit, taking 2 tablets rather than 1 tablets pending his response.  2. Hyperlipidemia, unspecified hyperlipidemia type Did want to check a lipid panel today to reassess, as he has not been taking a statin product. As he had an egg/sausage sandwich this morning, I did not feel appropriate checking today.  Would prefer to get a fasting specimen. Agreed to return fasting at his follow-up visit and will check them.  3. Overweight Encouraged to try to lose a little more weight as he wanted to do so.  Dietary modifications combined with increasing physical activity levels helpful and achieving that.  4.  Small subcutaneous nodule in the rectal region -noted may be from an ingrown hair, and very likely benign.  Not painful presently.  Is not a wart or hemorrhoid concern. We will continue to monitor presently, and if increasing in size, more symptoms arising, needs to follow-up.  Will follow up again in 4 weeks time, follow-up sooner as needed. We will recheck his blood pressure at that time and hopefully have some outside readings to review as well to assess. Also will check his lipid panel and comp panel at that time, and asked to be fasting to do so.   Jamelle Haring, MD 07/26/20 10:18 AM

## 2020-07-26 ENCOUNTER — Other Ambulatory Visit: Payer: Self-pay

## 2020-07-26 ENCOUNTER — Ambulatory Visit: Payer: BC Managed Care – PPO | Admitting: Internal Medicine

## 2020-07-26 ENCOUNTER — Encounter: Payer: Self-pay | Admitting: Internal Medicine

## 2020-07-26 VITALS — BP 170/90 | HR 77 | Temp 98.9°F | Resp 16 | Ht 66.0 in | Wt 185.4 lb

## 2020-07-26 DIAGNOSIS — E785 Hyperlipidemia, unspecified: Secondary | ICD-10-CM

## 2020-07-26 DIAGNOSIS — I10 Essential (primary) hypertension: Secondary | ICD-10-CM

## 2020-07-26 DIAGNOSIS — E663 Overweight: Secondary | ICD-10-CM | POA: Insufficient documentation

## 2020-07-26 MED ORDER — LISINOPRIL-HYDROCHLOROTHIAZIDE 10-12.5 MG PO TABS: 1.0000 | ORAL_TABLET | Freq: Every day | ORAL | 3 refills | Status: DC

## 2020-07-26 NOTE — Patient Instructions (Signed)

## 2020-08-22 NOTE — Progress Notes (Signed)
Patient ID: Alan Mathews, male    DOB: December 04, 1970, 49 y.o.   MRN: [id number]  PCP: Doren Custard, FNP  Chief Complaint  Patient presents with  . Hypertension    4 week follow up    Subjective:   Alan Mathews is a 49 y.o. male, presents to clinic with CC of the following:  Chief Complaint  Patient presents with  . Hypertension    4 week follow up    HPI:  Patient is a 49 year old male Last visit with me was 07/26/2020 Follows up today. Asked that he be fasting on his follow-up to recheck labs and also wanted to recheck his blood pressure again and he is. Was hoping to have some outside blood pressure readings to review as well this visit, not have any  All in all feeling good   Hyperlipidemia: Not taking atorvastatin. stopped as he felt not needed.  Lab Results  Component Value Date   CHOL 169 05/31/2018   HDL 52 05/31/2018   LDLCALC 98 05/31/2018   TRIG 101 05/31/2018   CHOLHDL 3.3 05/31/2018      Hypertension: Medication regimen -Zestoretic 10-12.5 daily (HCTZ added to lisinopril last visit) Not checking BP's at home.  BP Readings from Last 3 Encounters:  08/23/20 118/76  07/26/20 (!) 170/90  07/19/19 (!) 130/52    Denies chest pain, palpitations, shortness of breath, increased lower extremity swelling, increased headaches or vision changes.   Overweight Wt Readings from Last 3 Encounters:  08/23/20 179 lb 9.6 oz (81.5 kg)  07/26/20 185 lb 6.4 oz (84.1 kg)  07/19/19 188 lb 9.6 oz (85.5 kg)    Weight decreasing, was 195 this summer and lessened since.   He noted last visit that he would like to get closer to 175 in the near future. Only exercise is a lot of walking at work, and yard work, Not very restrictive with diet, is doing a little better trying to eat healthy, avoiding sodas  He notes he has had a little abdominal bloating and increased gas in the recent past, has started a simethicone type product which does really help.  Denies  marked abdominal pains.  No change in bowel habits with no dark or black stools, no bleeding per rectum.  Denies any epigastric pains or reflux symptoms.  Patient Active Problem List   Diagnosis Date Noted  . Overweight 07/26/2020  . Hyperlipidemia 07/26/2020  . Abnormal ECG 05/28/2018  . Non-adherence to medical treatment 05/28/2018  . Colon cancer screening 11/12/2017  . Blood in the stool 11/12/2017  . Preventative health care 11/12/2017  . Vitamin D deficiency 06/09/2017  . GERD (gastroesophageal reflux disease) 06/30/2016  . Essential hypertension 04/03/2015  . Allergic rhinitis 02/19/2015  . Anxiety 02/19/2015  . Lactose intolerance 02/19/2015  . Obstructive apnea 02/19/2015      Current Outpatient Medications:  .  atorvastatin (LIPITOR) 40 MG tablet, Take 1 tablet (40 mg total) by mouth daily., Disp: 90 tablet, Rfl: 3 .  lisinopril-hydrochlorothiazide (ZESTORETIC) 10-12.5 MG tablet, Take 1 tablet by mouth daily., Disp: 90 tablet, Rfl: 3  Not taking the atorvastatin  No Known Allergies   Past Surgical History:  Procedure Laterality Date  . TONSILLECTOMY AND ADENOIDECTOMY  04/03/2010     Family History  Problem Relation Age of Onset  . Hypertension Mother   . Hypertension Father   . Hypertension Brother   . Aneurysm Maternal Grandmother      Social History  Tobacco Use  . Smoking status: Never Smoker  . Smokeless tobacco: Never Used  Substance Use Topics  . Alcohol use: No    With staff assistance, above reviewed with the patient today.  ROS: As per HPI, otherwise no specific complaints on a limited and focused system review   No results found for this or any previous visit (from the past 72 hour(s)).   PHQ2/9: Depression screen [facility] 2/9 08/23/2020 07/26/2020 07/19/2019 01/06/2019 05/31/2018  Decreased Interest 0 0 0 0 0  Down, Depressed, Hopeless 0 0 0 0 0  PHQ - 2 Score 0 0 0 0 0  Altered sleeping - - 0 0 0  Tired, decreased energy - - 0 0 3   Change in appetite - - 0 0 0  Feeling bad or failure about yourself  - - 0 0 0  Trouble concentrating - - 0 0 0  Moving slowly or fidgety/restless - - 0 0 0  Suicidal thoughts - - 0 0 0  PHQ-9 Score - - 0 0 3  Difficult doing work/chores - - Not difficult at all Not difficult at all Not difficult at all   PHQ-2/9 Result is neg  Fall Risk: Fall Risk  08/23/2020 07/26/2020 07/19/2019 01/06/2019 05/31/2018  Falls in the past year? 0 0 0 0 No  Number falls in past yr: 0 0 0 0 -  Injury with Fall? 0 0 0 0 -  Follow up Falls evaluation completed - - Falls evaluation completed -      Objective:   Vitals:   08/23/20 1003  BP: 118/76  Pulse: 77  Resp: 18  Temp: 97.9 F (36.6 C)  TempSrc: Oral  SpO2: 97%  Weight: 179 lb 9.6 oz (81.5 kg)  Height: 5\' 6"  (1.676 m)    Body mass index is 28.99 kg/m.  Physical Exam   NAD, masked, pleasant HEENT - [place]/AT, sclera anicteric, PERRL,  EOMI,  pharynx clear Neck - supple, no adenopathy, carotids 2+ and = without bruits bilat Car - RRR without m/g/r Pulm- RR and effort normal at rest, CTA without wheeze or rales Abd - soft, mildly obese, NT diffusely, ND,  Back - no CVA tenderness Ext - no LE edema,  Neuro/psychiatric - affect was not flat, appropriate with conversation             Alert              Grossly non-focal              Speech normal  Results for orders placed or performed in visit on 05/31/18  Lipid panel  Result Value Ref Range   Cholesterol 169 <200 mg/dL   HDL 52 06/02/18 mg/dL   Triglycerides >80 998 mg/dL   LDL Cholesterol (Calc) 98 mg/dL (calc)   Total CHOL/HDL Ratio 3.3 <5.0 (calc)   Non-HDL Cholesterol (Calc) 117 <130 mg/dL (calc)  CRP High sensitivity  Result Value Ref Range   hs-CRP 1.6 mg/L   Prior labs reviewed, from 2019    Assessment & Plan:   1. Essential hypertension Blood pressure much better controlled on the Zestoretic product, taking once daily We will continue, and continue to monitor Also  will check labs today  2. Hyperlipidemia, unspecified hyperlipidemia type Did return fasting and we will check a lipid panel today Discussed the statin products, and he was not opposed to restarting if felt indicated. Did put information in the AVS on cholesterol, and noted dietary modifications and staying very  physically active important to help as well.  3. Overweight Has had success losing weight in the recent past, with his goal of trying to get to about 175 noted previously. Continue to monitor.  4.   Abdominal bloating Do feel related to increased gas as he noted, with the simethicone he has been taking very helpful. Also noted diet plays a big role with this and foods that can be higher and gas producing. If develops more pain issues, or other more concerning symptoms, needs to follow-up.  Await lab tests ordered today, and tentatively schedule a follow-up for 6 months time, follow-up sooner as needed He is aware that his follow-up will be with a new provider as I will be leaving this practice prior to that planned follow-up     Jamelle Haring, MD 08/23/20 10:27 AM

## 2020-08-23 ENCOUNTER — Ambulatory Visit: Payer: BC Managed Care – PPO | Admitting: Internal Medicine

## 2020-08-23 ENCOUNTER — Other Ambulatory Visit: Payer: Self-pay

## 2020-08-23 ENCOUNTER — Encounter: Payer: Self-pay | Admitting: Internal Medicine

## 2020-08-23 VITALS — BP 118/76 | HR 77 | Temp 97.9°F | Resp 18 | Ht 66.0 in | Wt 179.6 lb

## 2020-08-23 DIAGNOSIS — R140 Abdominal distension (gaseous): Secondary | ICD-10-CM | POA: Diagnosis not present

## 2020-08-23 DIAGNOSIS — E663 Overweight: Secondary | ICD-10-CM | POA: Diagnosis not present

## 2020-08-23 DIAGNOSIS — I10 Essential (primary) hypertension: Secondary | ICD-10-CM

## 2020-08-23 DIAGNOSIS — E785 Hyperlipidemia, unspecified: Secondary | ICD-10-CM

## 2020-08-23 NOTE — Patient Instructions (Signed)

## 2020-08-24 LAB — CBC WITH DIFFERENTIAL/PLATELET
Absolute Monocytes: 570 cells/uL (ref 200–950)
Basophils Absolute: 39 cells/uL (ref 0–200)
Basophils Relative: 0.5 %
Eosinophils Absolute: 131 cells/uL (ref 15–500)
Eosinophils Relative: 1.7 %
HCT: 45.7 % (ref 38.5–50.0)
Hemoglobin: 15.5 g/dL (ref 13.2–17.1)
Lymphs Abs: 1579 cells/uL (ref 850–3900)
MCH: 28.3 pg (ref 27.0–33.0)
MCHC: 33.9 g/dL (ref 32.0–36.0)
MCV: 83.4 fL (ref 80.0–100.0)
MPV: 11.3 fL (ref 7.5–12.5)
Monocytes Relative: 7.4 %
Neutro Abs: 5382 cells/uL (ref 1500–7800)
Neutrophils Relative %: 69.9 %
Platelets: 274 10*3/uL (ref 140–400)
RBC: 5.48 10*6/uL (ref 4.20–5.80)
RDW: 14 % (ref 11.0–15.0)
Total Lymphocyte: 20.5 %
WBC: 7.7 10*3/uL (ref 3.8–10.8)

## 2020-08-24 LAB — LIPID PANEL
Cholesterol: 182 mg/dL (ref <200)
HDL: 57 mg/dL (ref >=40)
LDL Cholesterol (Calc): 110 mg/dL (calc) — ABNORMAL HIGH
Non-HDL Cholesterol (Calc): 125 mg/dL (calc) (ref <130)
Total CHOL/HDL Ratio: 3.2 (calc) (ref <5.0)
Triglycerides: 68 mg/dL (ref <150)

## 2020-08-24 LAB — COMPLETE METABOLIC PANEL WITH GFR
AG Ratio: 2 (calc) (ref 1.0–2.5)
ALT: 21 U/L (ref 9–46)
AST: 21 U/L (ref 10–40)
Albumin: 4.5 g/dL (ref 3.6–5.1)
Alkaline phosphatase (APISO): 81 U/L (ref 36–130)
BUN: 22 mg/dL (ref 7–25)
CO2: 26 mmol/L (ref 20–32)
Calcium: 9.6 mg/dL (ref 8.6–10.3)
Chloride: 105 mmol/L (ref 98–110)
Creat: 1.12 mg/dL (ref 0.60–1.35)
GFR, Est African American: 89 mL/min/{1.73_m2} (ref >=60)
GFR, Est Non African American: 77 mL/min/{1.73_m2} (ref >=60)
Globulin: 2.3 g/dL (calc) (ref 1.9–3.7)
Glucose, Bld: 82 mg/dL (ref 65–99)
Potassium: 4.6 mmol/L (ref 3.5–5.3)
Sodium: 140 mmol/L (ref 135–146)
Total Bilirubin: 0.4 mg/dL (ref 0.2–1.2)
Total Protein: 6.8 g/dL (ref 6.1–8.1)

## 2020-10-24 ENCOUNTER — Encounter: Payer: Self-pay | Admitting: Family Medicine

## 2020-10-24 ENCOUNTER — Telehealth (INDEPENDENT_AMBULATORY_CARE_PROVIDER_SITE_OTHER): Payer: BC Managed Care – PPO | Admitting: Family Medicine

## 2020-10-24 DIAGNOSIS — R059 Cough, unspecified: Secondary | ICD-10-CM

## 2020-10-24 NOTE — Progress Notes (Signed)
Virtual Visit via Video Note  I connected with Alan Mathews on 10/24/20 at  4:00 PM EST by a video enabled telemedicine application and verified that I am speaking with the correct person using two identifiers.  Location: Patient: home Provider: [facility]   I discussed the limitations of evaluation and management by telemedicine and the availability of in person appointments. The patient expressed understanding and agreed to proceed.  History of Present Illness:  COUGH Duration: weeks Circumstances of initial development of cough: URI (runny nose, congestion). Just tested for COVID yesterday in [place]. Runny nose and congestion better.  Cough severity: mild Cough description: productive of mucous Aggravating factors:  nothing  Alleviating factors: cough drops Status:  better Treatments attempted: cough drops, Vicks vapor rub Wheezing: no Shortness of breath: no Chest pain: no Chest tightness:no Nasal congestion: no, better Runny nose: no Frequent throat clearing or swallowing: yes Hemoptysis: no Fevers: no Weight loss: no Heartburn: no Recent foreign travel: no Tuberculosis contacts: no  taking nyquil and cough drops.  No leg swelling, PND, orthopnea.   Observations/Objective:  Patient had trouble connecting to video visit, entirety of visit conducted over the phone.  Speaks in full sentences, no respiratory distress.  Assessment and Plan:  Cough Likely 2/2 from recent URI. No sx to concern for new onset CHF, PNA, ACEI cough. Awaiting COVID results though is outside the window for quarantine and MAB tx. Recommend continued symptomatic care with OTC cough suppressant, antihistamine, mucinex. Emergency and return precautions reviewed. F/u prn.    I discussed the assessment and treatment plan with the patient. The patient was provided an opportunity to ask questions and all were answered. The patient agreed with the plan and demonstrated an understanding of the  instructions.   The patient was advised to call back or seek an in-person evaluation if the symptoms worsen or if the condition fails to improve as anticipated.  I provided 8 minutes of non-face-to-face time during this encounter.   Caro Laroche, DO

## 2020-10-24 NOTE — Patient Instructions (Signed)
It was great to see you! Your cough is likely from your recent viral infection and should get better with time.  Our plans for today:  - Try mucinex, zyrtec for your cough. - If you develop fevers, leg swelling, or difficulties breathing, come back to see [place].   Take care and seek immediate care sooner if you develop any concerns.   Dr. Linwood Dibbles

## 2020-11-27 ENCOUNTER — Other Ambulatory Visit: Payer: Self-pay

## 2020-11-27 ENCOUNTER — Ambulatory Visit (INDEPENDENT_AMBULATORY_CARE_PROVIDER_SITE_OTHER): Payer: BC Managed Care – PPO | Admitting: Family Medicine

## 2020-11-27 ENCOUNTER — Encounter: Payer: Self-pay | Admitting: Family Medicine

## 2020-11-27 VITALS — BP 202/100 | HR 66 | Temp 97.8°F | Resp 16 | Ht 66.0 in | Wt 184.4 lb

## 2020-11-27 DIAGNOSIS — Z0000 Encounter for general adult medical examination without abnormal findings: Secondary | ICD-10-CM | POA: Diagnosis not present

## 2020-11-27 DIAGNOSIS — I10 Essential (primary) hypertension: Secondary | ICD-10-CM

## 2020-11-27 DIAGNOSIS — R21 Rash and other nonspecific skin eruption: Secondary | ICD-10-CM

## 2020-11-27 DIAGNOSIS — L910 Hypertrophic scar: Secondary | ICD-10-CM | POA: Diagnosis not present

## 2020-11-27 DIAGNOSIS — E663 Overweight: Secondary | ICD-10-CM | POA: Diagnosis not present

## 2020-11-27 MED ORDER — CLOTRIMAZOLE 1 % EX OINT: 1.0000 "application " | TOPICAL_OINTMENT | Freq: Two times a day (BID) | CUTANEOUS | 1 refills | Status: DC

## 2020-11-27 MED ORDER — LISINOPRIL 20 MG PO TABS
20.0000 mg | ORAL_TABLET | Freq: Every day | ORAL | 3 refills | Status: DC
Start: 2020-11-27 — End: 2020-12-28

## 2020-11-27 MED ORDER — CETIRIZINE HCL 10 MG PO TABS: 10.0000 mg | ORAL_TABLET | Freq: Every day | ORAL | 1 refills | Status: DC

## 2020-11-27 NOTE — Assessment & Plan Note (Addendum)
Rash in gluteal fold and back consistent with tinea, will provide topical antifungal as well as antihistamine for itching. F/u if no better.

## 2020-11-27 NOTE — Progress Notes (Signed)
   SUBJECTIVE:   CHIEF COMPLAINT / HPI:   RASH - small bumps, comes and goes. Rash in gluteal fold difficult to see. - sometimes itchy Duration:  1-2 months  Location: trunk and arms  Itching: yes Burning: no Redness: no Oozing: no Scaling: somewhat Blisters: no Painful: no Fevers: no Change in detergents/soaps/personal care products: no Recent illness: no History of same: no Context: fluctuating Alleviating factors: nothing Treatments attempted:nothing Shortness of breath: no  Throat/tongue swelling: no Myalgias/arthralgias: no   Hypertension: - Medications: lisinopril-HCTZ 10-12.5mg  - Compliance: not good, has lower back pain ever since HCTZ was added. Doesn't get back pain when doesn't take med. - Denies any SOB, CP, vision changes, LE edema, medication SEs, or symptoms of hypotension  Keloid - noted to central chest - wants to see about getting steroid injection   OBJECTIVE:   BP (!) 202/100   Pulse 66   Temp 97.8 F (36.6 C) (Oral)   Resp 16   Ht 5\' 6"  (1.676 m)   Wt 184 lb 6.4 oz (83.6 kg)   SpO2 97%   BMI 29.76 kg/m   Gen: well appearing, in NAD Skin: small flesh-colored papules noted to R forearm. Circular patches to R gluteal fold with central clearing and hyperpigmentation with scaled edges. Circular hyperpigmented patches to back diffusely.  ASSESSMENT/PLAN:   Essential hypertension Elevated today and on recheck though did not take medication today. Has not taken as thinks the HCTZ is causing his back pain. Will switch to lisinopril 20mg  and f/u in 1 month. Obtaining labs today.   Rash Rash in gluteal fold consistent with tinea, will provide topical antifungal as well as antihistamine for itching. F/u if no better.    , DO

## 2020-11-27 NOTE — Patient Instructions (Signed)
It was great to see you!  Our plans for today:  - Use the ointment on your rash. Let us know if you don't see improvement in the next week or so. - Take the cetirizine for itching. - We are changing your blood pressure medication. Take this daily and come back in 1 month for follow up.   We are checking some labs today, we will release these results to your MyChart.  Take care and seek immediate care sooner if you develop any concerns.   Dr. Linwood Dibbles

## 2020-11-27 NOTE — Assessment & Plan Note (Signed)
Elevated today and on recheck though did not take medication today. Has not taken as thinks the HCTZ is causing his back pain. Will switch to lisinopril 20mg  and f/u in 1 month. Obtaining labs today.

## 2020-11-28 LAB — BASIC METABOLIC PANEL
BUN: 14 mg/dL (ref 7–25)
CO2: 26 mmol/L (ref 20–32)
Calcium: 9.2 mg/dL (ref 8.6–10.3)
Chloride: 108 mmol/L (ref 98–110)
Creat: 1.03 mg/dL (ref 0.70–1.33)
Glucose, Bld: 89 mg/dL (ref 65–99)
Potassium: 4.3 mmol/L (ref 3.5–5.3)
Sodium: 141 mmol/L (ref 135–146)

## 2020-11-28 LAB — HEMOGLOBIN A1C
Hgb A1c MFr Bld: 5.6 % of total Hgb (ref <5.7)
Mean Plasma Glucose: 114 mg/dL
eAG (mmol/L): 6.3 mmol/L

## 2020-12-25 ENCOUNTER — Ambulatory Visit: Payer: BC Managed Care – PPO | Admitting: Physician Assistant

## 2020-12-28 ENCOUNTER — Ambulatory Visit: Payer: BC Managed Care – PPO | Admitting: Family Medicine

## 2020-12-28 ENCOUNTER — Other Ambulatory Visit: Payer: Self-pay

## 2020-12-28 ENCOUNTER — Encounter: Payer: Self-pay | Admitting: Family Medicine

## 2020-12-28 VITALS — BP 140/88 | HR 77 | Temp 97.9°F | Resp 16 | Ht 66.0 in | Wt 179.0 lb

## 2020-12-28 DIAGNOSIS — I10 Essential (primary) hypertension: Secondary | ICD-10-CM | POA: Diagnosis not present

## 2020-12-28 MED ORDER — LISINOPRIL 20 MG PO TABS: 30.0000 mg | ORAL_TABLET | Freq: Every day | ORAL | 3 refills | Status: DC

## 2020-12-28 NOTE — Progress Notes (Signed)
4/22/20229:34 AM  Alan Mathews 05-15-1971, 50 y.o., male [id number]  Chief Complaint  Patient presents with  . Follow-up    HPI:   Patient is a 50 y.o. male with past medical history significant for HTN and allergies who presents today for chronic care follow up.  Last seen 11/27/20 Rash resolved from last visit Was extremely hypertensive at that time BP 202/100 Felt HCTZ was causing him back pain Last OV stopped HCTZ and transitioned to Lisinopril 20mg  Goal< 130/80 Doesn't take BP at home BP Readings from Last 3 Encounters:  12/28/20 140/88  11/27/20 (!) 202/100  08/23/20 118/76   Lab Results  Component Value Date   NA 141 11/27/2020   K 4.3 11/27/2020   CO2 26 11/27/2020   GLUCOSE 89 11/27/2020   BUN 14 11/27/2020   CREATININE 1.03 11/27/2020   CALCIUM 9.2 11/27/2020   GFRNONAA 77 08/23/2020   GFRAA 89 08/23/2020      Depression screen PHQ 2/9 12/28/2020 11/27/2020 10/24/2020  Decreased Interest 0 0 0  Down, Depressed, Hopeless 0 0 0  PHQ - 2 Score 0 0 0  Altered sleeping - - -  Tired, decreased energy - - -  Change in appetite - - -  Feeling bad or failure about yourself  - - -  Trouble concentrating - - -  Moving slowly or fidgety/restless - - -  Suicidal thoughts - - -  PHQ-9 Score - - -  Difficult doing work/chores - - -    Fall Risk  12/28/2020 11/27/2020 10/24/2020 08/23/2020 07/26/2020  Falls in the past year? 0 0 0 0 0  Number falls in past yr: 0 0 0 0 0  Injury with Fall? 0 0 0 0 0  Follow up - - - Falls evaluation completed -     No Known Allergies  Prior to Admission medications   Medication Sig Start Date End Date Taking? Authorizing Provider  cetirizine (ZYRTEC) 10 MG tablet Take 1 tablet (10 mg total) by mouth daily. 11/27/20  Yes 11/29/20, DO  Clotrimazole 1 % OINT Apply 1 application topically in the morning and at bedtime. 11/27/20  Yes 11/29/20, DO  lisinopril (ZESTRIL) 20 MG tablet Take 1 tablet (20 mg total) by  mouth daily. 11/27/20  Yes 11/29/20, DO    Past Medical History:  Diagnosis Date  . Hypertension     Past Surgical History:  Procedure Laterality Date  . TONSILLECTOMY AND ADENOIDECTOMY  04/03/2010    Social History   Tobacco Use  . Smoking status: Never Smoker  . Smokeless tobacco: Never Used  Substance Use Topics  . Alcohol use: No    Family History  Problem Relation Age of Onset  . Hypertension Mother   . Hypertension Father   . Hypertension Brother   . Aneurysm Maternal Grandmother     Review of Systems  Respiratory: Negative.   Cardiovascular: Negative.   Gastrointestinal: Negative.   Musculoskeletal: Negative.   Skin: Negative.   Neurological: Negative.      OBJECTIVE:  Today's Vitals   12/28/20 0922  BP: 140/88  Pulse: 77  Resp: 16  Temp: 97.9 F (36.6 C)  TempSrc: Oral  SpO2: 99%  Weight: 179 lb (81.2 kg)  Height: 5\' 6"  (1.676 m)   Body mass index is 28.89 kg/m.   Physical Exam Vitals reviewed.  Constitutional:      Appearance: Normal appearance.  HENT:     Head: Normocephalic and atraumatic.  Eyes:     Conjunctiva/sclera: Conjunctivae normal.     Pupils: Pupils are equal, round, and reactive to light.  Cardiovascular:     Rate and Rhythm: Normal rate and regular rhythm.     Pulses: Normal pulses.     Heart sounds: Normal heart sounds. No murmur heard. No friction rub. No gallop.   Pulmonary:     Effort: Pulmonary effort is normal. No respiratory distress.     Breath sounds: Normal breath sounds. No stridor. No wheezing or rales.  Abdominal:     General: Bowel sounds are normal.     Palpations: Abdomen is soft.     Tenderness: There is no abdominal tenderness.  Musculoskeletal:     Right lower leg: No edema.     Left lower leg: No edema.  Skin:    General: Skin is warm and dry.  Neurological:     General: No focal deficit present.     Mental Status: He is alert and oriented to person, place, and time.  Psychiatric:         Mood and Affect: Mood normal.        Behavior: Behavior normal.     No results found for this or any previous visit (from the past 24 hour(s)).  No results found.   ASSESSMENT and PLAN  Problem List Items Addressed This Visit      Cardiovascular and Mediastinum   Essential hypertension - Primary   Relevant Medications   lisinopril (ZESTRIL) 20 MG tablet      Plan . Increase lisinopril from 20mg  to 30mg  for goal BP < 130/80 . R/se/b of medications discussed . Will repeat labs next visit   Return in about 6 months (around 06/29/2021).    Admiral Marcucci, FNP-BC [facility]

## 2020-12-28 NOTE — Patient Instructions (Signed)

## 2021-07-01 ENCOUNTER — Other Ambulatory Visit: Payer: Self-pay

## 2021-07-01 ENCOUNTER — Ambulatory Visit (INDEPENDENT_AMBULATORY_CARE_PROVIDER_SITE_OTHER): Payer: BC Managed Care – PPO | Admitting: Nurse Practitioner

## 2021-07-01 ENCOUNTER — Encounter: Payer: Self-pay | Admitting: Nurse Practitioner

## 2021-07-01 ENCOUNTER — Telehealth: Payer: Self-pay

## 2021-07-01 VITALS — BP 134/92 | HR 67 | Temp 97.9°F | Resp 18 | Ht 66.0 in | Wt 171.7 lb

## 2021-07-01 DIAGNOSIS — Z1211 Encounter for screening for malignant neoplasm of colon: Secondary | ICD-10-CM

## 2021-07-01 DIAGNOSIS — I10 Essential (primary) hypertension: Secondary | ICD-10-CM | POA: Diagnosis not present

## 2021-07-01 DIAGNOSIS — E785 Hyperlipidemia, unspecified: Secondary | ICD-10-CM

## 2021-07-01 DIAGNOSIS — Z79899 Other long term (current) drug therapy: Secondary | ICD-10-CM | POA: Diagnosis not present

## 2021-07-01 DIAGNOSIS — Z131 Encounter for screening for diabetes mellitus: Secondary | ICD-10-CM

## 2021-07-01 DIAGNOSIS — Z23 Encounter for immunization: Secondary | ICD-10-CM | POA: Diagnosis not present

## 2021-07-01 MED ORDER — LISINOPRIL 20 MG PO TABS: 30.0000 mg | ORAL_TABLET | Freq: Every day | ORAL | 3 refills | Status: DC

## 2021-07-01 MED ORDER — PEG 3350-KCL-NA BICARB-NACL 420 G PO SOLR: 4000.0000 mL | Freq: Once | ORAL | 0 refills | Status: AC

## 2021-07-01 NOTE — Progress Notes (Signed)
BP (!) 134/92   Pulse 67   Temp 97.9 F (36.6 C) (Oral)   Resp 18   Ht 5\' 6"  (1.676 m)   Wt 171 lb 11.2 oz (77.9 kg)   SpO2 99%   BMI 27.71 kg/m    Subjective:    Patient ID: Alan Mathews, male    DOB: August 17, 1971, 50 y.o.   MRN: [id number]  HPI: Alan Mathews is a 50 y.o. male  Chief Complaint  Patient presents with   Hypertension    6 month follow up   HTN:  He says he has had high blood pressure for years.  He has been taking lisinopril 20 mg daily.  Although his dose was changed in 12/28/20 to 30 mg he has not been doing that. His blood pressure was 134/92 in the office.  He denies any chest pain or shortness of breath.  Discussed that he was supposed to be taking 30 mg daily of his lisinopril.  He verbalized understanding.  He will start taking his lisinopril 30 mg today.  He also said he thinks he did not take his blood pressure medication at all yesterday.  Discussed keeping a record of his blood pressure at home.  He is going to keep a long and bring to his next appointment.   Hyperlipidemia:  His LDL was 110 on 08/23/20.  Will recheck labs today. He is not currently on any treatment.  He was on Lipitor in 2019, but stopped taking it. He has been doing dietary modifications and staying physically active.   The 10-year ASCVD risk score (Arnett DK, et al., 2019) is: 8.9%   Values used to calculate the score:     Age: 17 years     Sex: Male     Is Non-Hispanic African American: Yes     Diabetic: No     Tobacco smoker: No     Systolic Blood Pressure: 134 mmHg     Is BP treated: Yes     HDL Cholesterol: 57 mg/dL     Total Cholesterol: 182 mg/dL   Relevant past medical, surgical, family and social history reviewed and updated as indicated. Interim medical history since our last visit reviewed. Allergies and medications reviewed and updated.  Review of Systems  Constitutional: Negative for fever or weight change.  Respiratory: Negative for cough and shortness of breath.    Cardiovascular: Negative for chest pain or palpitations.  Gastrointestinal: Negative for abdominal pain, no bowel changes.  Musculoskeletal: Negative for gait problem or joint swelling.  Skin: Negative for rash.  Neurological: Negative for dizziness or headache.  No other specific complaints in a complete review of systems (except as listed in HPI above).      Objective:    BP (!) 134/92   Pulse 67   Temp 97.9 F (36.6 C) (Oral)   Resp 18   Ht 5\' 6"  (1.676 m)   Wt 171 lb 11.2 oz (77.9 kg)   SpO2 99%   BMI 27.71 kg/m   Wt Readings from Last 3 Encounters:  07/01/21 171 lb 11.2 oz (77.9 kg)  12/28/20 179 lb (81.2 kg)  11/27/20 184 lb 6.4 oz (83.6 kg)    Physical Exam  Constitutional: Patient appears well-developed and well-nourished. No distress.  HEENT: head atraumatic, normocephalic, pupils equal and reactive to light,  neck supple Cardiovascular: Normal rate, regular rhythm and normal heart sounds.  No murmur heard. No BLE edema. Pulmonary/Chest: Effort normal and breath sounds normal. No  respiratory distress. Abdominal: Soft.  There is no tenderness. Psychiatric: Patient has a normal mood and affect. behavior is normal. Judgment and thought content normal.   Results for orders placed or performed in visit on 11/27/20  Basic Metabolic Panel (BMET)  Result Value Ref Range   Glucose, Bld 89 65 - 99 mg/dL   BUN 14 7 - 25 mg/dL   Creat 9.50 9.32 - 6.71 mg/dL   BUN/Creatinine Ratio NOT APPLICABLE 6 - 22 (calc)   Sodium 141 135 - 146 mmol/L   Potassium 4.3 3.5 - 5.3 mmol/L   Chloride 108 98 - 110 mmol/L   CO2 26 20 - 32 mmol/L   Calcium 9.2 8.6 - 10.3 mg/dL  Hemoglobin I4P  Result Value Ref Range   Hgb A1c MFr Bld 5.6 <5.7 % of total Hgb   Mean Plasma Glucose 114 mg/dL   eAG (mmol/L) 6.3 mmol/L      Assessment & Plan:   1. Essential hypertension -keep blood pressure journal and bring to next appointment - lisinopril (ZESTRIL) 20 MG tablet; Take 1.5 tablets (30 mg  total) by mouth daily.  Dispense: 90 tablet; Refill: 3 - CBC with Differential/Platelet - COMPLETE METABOLIC PANEL WITH GFR  2. Hyperlipidemia, unspecified hyperlipidemia type  - Lipid panel  3. Screening for diabetes mellitus  - COMPLETE METABOLIC PANEL WITH GFR - Hemoglobin A1c  4. Encounter for long-term current use of medication  - CBC with Differential/Platelet - COMPLETE METABOLIC PANEL WITH GFR  5. Colon cancer screening  - Ambulatory referral to Gastroenterology  6. Need for influenza vaccination  - Flu Vaccine QUAD 6+ mos PF IM (Fluarix Quad PF)   Follow up plan: Return in about 6 months (around 12/30/2021) for follow up.

## 2021-07-01 NOTE — Telephone Encounter (Signed)
Procedure has been moved to 08/09/21 with Tobi Bastos.

## 2021-07-01 NOTE — Telephone Encounter (Signed)
Pt. Calling to reschedule colonoscopy 

## 2021-07-01 NOTE — Progress Notes (Signed)
Gastroenterology Pre-Procedure Review  Request Date: 07/23/21 Requesting Physician: Dr. Servando Snare  PATIENT REVIEW QUESTIONS: The patient responded to the following health history questions as indicated:    1. Are you having any GI issues? no 2. Do you have a personal history of Polyps? no 3. Do you have a family history of Colon Cancer or Polyps? no 4. Diabetes Mellitus? no 5. Joint replacements in the past 12 months?no 6. Major health problems in the past 3 months?no 7. Any artificial heart valves, MVP, or defibrillator?no    MEDICATIONS & ALLERGIES:    Patient reports the following regarding taking any anticoagulation/antiplatelet therapy:   Plavix, Coumadin, Eliquis, Xarelto, Lovenox, Pradaxa, Brilinta, or Effient? no Aspirin? no  Patient confirms/reports the following medications:  Current Outpatient Medications  Medication Sig Dispense Refill   lisinopril (ZESTRIL) 20 MG tablet Take 1.5 tablets (30 mg total) by mouth daily. 90 tablet 3   No current facility-administered medications for this visit.    Patient confirms/reports the following allergies:  No Known Allergies  No orders of the defined types were placed in this encounter.   AUTHORIZATION INFORMATION Primary Insurance: 1D#: Group #:  Secondary Insurance: 1D#: Group #:  SCHEDULE INFORMATION: Date: 07/23/21 Time: Location: [facility]

## 2021-07-02 LAB — COMPLETE METABOLIC PANEL WITH GFR
AG Ratio: 1.8 (calc) (ref 1.0–2.5)
ALT: 15 U/L (ref 9–46)
AST: 15 U/L (ref 10–35)
Albumin: 4.2 g/dL (ref 3.6–5.1)
Alkaline phosphatase (APISO): 82 U/L (ref 35–144)
BUN: 22 mg/dL (ref 7–25)
CO2: 30 mmol/L (ref 20–32)
Calcium: 9.8 mg/dL (ref 8.6–10.3)
Chloride: 104 mmol/L (ref 98–110)
Creat: 1.12 mg/dL (ref 0.70–1.30)
Globulin: 2.3 g/dL (calc) (ref 1.9–3.7)
Glucose, Bld: 89 mg/dL (ref 65–99)
Potassium: 5.2 mmol/L (ref 3.5–5.3)
Sodium: 141 mmol/L (ref 135–146)
Total Bilirubin: 0.3 mg/dL (ref 0.2–1.2)
Total Protein: 6.5 g/dL (ref 6.1–8.1)
eGFR: 80 mL/min/{1.73_m2} (ref >=60)

## 2021-07-02 LAB — LIPID PANEL
Cholesterol: 165 mg/dL (ref <200)
HDL: 65 mg/dL (ref >=40)
LDL Cholesterol (Calc): 86 mg/dL (calc)
Non-HDL Cholesterol (Calc): 100 mg/dL (calc) (ref <130)
Total CHOL/HDL Ratio: 2.5 (calc) (ref <5.0)
Triglycerides: 56 mg/dL (ref <150)

## 2021-07-02 LAB — HEMOGLOBIN A1C
Hgb A1c MFr Bld: 5.4 % of total Hgb (ref <5.7)
Mean Plasma Glucose: 108 mg/dL
eAG (mmol/L): 6 mmol/L

## 2021-07-02 LAB — CBC WITH DIFFERENTIAL/PLATELET
Absolute Monocytes: 624 cells/uL (ref 200–950)
Basophils Absolute: 47 cells/uL (ref 0–200)
Basophils Relative: 0.6 %
Eosinophils Absolute: 70 cells/uL (ref 15–500)
Eosinophils Relative: 0.9 %
HCT: 43.3 % (ref 38.5–50.0)
Hemoglobin: 14.6 g/dL (ref 13.2–17.1)
Lymphs Abs: 1716 cells/uL (ref 850–3900)
MCH: 29.1 pg (ref 27.0–33.0)
MCHC: 33.7 g/dL (ref 32.0–36.0)
MCV: 86.4 fL (ref 80.0–100.0)
MPV: 11.2 fL (ref 7.5–12.5)
Monocytes Relative: 8 %
Neutro Abs: 5343 cells/uL (ref 1500–7800)
Neutrophils Relative %: 68.5 %
Platelets: 245 10*3/uL (ref 140–400)
RBC: 5.01 10*6/uL (ref 4.20–5.80)
RDW: 14.2 % (ref 11.0–15.0)
Total Lymphocyte: 22 %
WBC: 7.8 10*3/uL (ref 3.8–10.8)

## 2021-08-09 ENCOUNTER — Ambulatory Visit
Admission: RE | Admit: 2021-08-09 | Payer: BC Managed Care – PPO | Source: Ambulatory Visit | Admitting: Gastroenterology

## 2021-08-09 ENCOUNTER — Encounter: Admission: RE | Payer: Self-pay | Source: Ambulatory Visit

## 2021-08-09 ENCOUNTER — Encounter: Payer: Self-pay | Admitting: Certified Registered Nurse Anesthetist

## 2021-08-09 SURGERY — COLONOSCOPY WITH PROPOFOL
Anesthesia: General

## 2021-10-25 ENCOUNTER — Ambulatory Visit
Admission: RE | Admit: 2021-10-25 | Discharge: 2021-10-25 | Disposition: A | Discharge status: Home / Self Care | Payer: BC Managed Care – PPO | Attending: Nurse Practitioner | Admitting: Nurse Practitioner

## 2021-10-25 ENCOUNTER — Other Ambulatory Visit: Payer: Self-pay

## 2021-10-25 ENCOUNTER — Ambulatory Visit
Admission: RE | Admit: 2021-10-25 | Discharge: 2021-10-25 | Disposition: A | Discharge status: Home / Self Care | Payer: BC Managed Care – PPO | Source: Ambulatory Visit | Attending: Nurse Practitioner | Admitting: Nurse Practitioner

## 2021-10-25 ENCOUNTER — Encounter: Payer: Self-pay | Admitting: Nurse Practitioner

## 2021-10-25 ENCOUNTER — Telehealth (INDEPENDENT_AMBULATORY_CARE_PROVIDER_SITE_OTHER): Payer: BC Managed Care – PPO | Admitting: Nurse Practitioner

## 2021-10-25 DIAGNOSIS — Z1211 Encounter for screening for malignant neoplasm of colon: Secondary | ICD-10-CM | POA: Diagnosis not present

## 2021-10-25 DIAGNOSIS — K5900 Constipation, unspecified: Secondary | ICD-10-CM

## 2021-10-25 NOTE — Progress Notes (Signed)
Name: Alan Mathews   MRN: [id number]    DOB: Jun 29, 1971   Date:10/25/2021       Progress Note  Subjective  Chief Complaint  Chief Complaint  Patient presents with   Constipation    Passing gas frequently but not knowing he is passing     I connected with  Alan Mathews  on 10/25/21 at  1:00 PM EST by a video enabled telemedicine application and verified that I am speaking with the correct person using two identifiers.  I discussed the limitations of evaluation and management by telemedicine and the availability of in person appointments. The patient expressed understanding and agreed to proceed with a virtual visit  Staff also discussed with the patient that there may be a patient responsible charge related to this service. Patient Location: home Provider Location: [facility] Additional Individuals present: alone  HPI  Constipation/bloating/flatulence:  He says he has had this problem before but it most recently restarted over the last week.  He says he has bowel movements about 3-4 times a day and his stool varies on the bristol scale from 1-4.  He says that he is having malodorous flatulence.  He says he has been drinking lots of water, has taken gas x and has been watching his food intake. He says he does not drink out of a straw, he does not drink carbonated beverages.  He denies any abdominal pain.  Discussed adding more fiber in his diet, using a stool softener.  He is also going to get a KUB, if no obstruction may try Linzess.  Also spoke to patient about him getting his colonoscopy.  It was scheduled but he cancelled it.  He says he is going to reschedule it.   Patient Active Problem List   Diagnosis Date Noted   Rash 11/27/2020   Overweight 07/26/2020   Hyperlipidemia 07/26/2020   Abnormal ECG 05/28/2018   Non-adherence to medical treatment 05/28/2018   Colon cancer screening 11/12/2017   Blood in the stool 11/12/2017   Preventative health care 11/12/2017   Vitamin D deficiency  06/09/2017   GERD (gastroesophageal reflux disease) 06/30/2016   Essential hypertension 04/03/2015   Allergic rhinitis 02/19/2015   Anxiety 02/19/2015   Lactose intolerance 02/19/2015   Obstructive apnea 02/19/2015    Social History   Tobacco Use   Smoking status: Never   Smokeless tobacco: Never  Substance Use Topics   Alcohol use: No     Current Outpatient Medications:    lisinopril (ZESTRIL) 20 MG tablet, Take 1.5 tablets (30 mg total) by mouth daily., Disp: 90 tablet, Rfl: 3  No Known Allergies  I personally reviewed active problem list, medication list, allergies, notes from last encounter with the patient/caregiver today.  ROS  Constitutional: Negative for fever or weight change.  Respiratory: Negative for cough and shortness of breath.   Cardiovascular: Negative for chest pain or palpitations.  Gastrointestinal: Negative for abdominal pain, positve for bloating, constipation and flatulance Musculoskeletal: Negative for gait problem or joint swelling.  Skin: Negative for rash.  Neurological: Negative for dizziness or headache.  No other specific complaints in a complete review of systems (except as listed in HPI above).   Objective  Virtual encounter, vitals not obtained.  There is no height or weight on file to calculate BMI.  Nursing Note and Vital Signs reviewed.  Physical Exam  Awake, alert and oriented, speaking in complete sentences  No results found for this or any previous visit (from the past  72 hour(s)).  Assessment & Plan  1. Constipation, unspecified constipation type -increase fiber, drink water -take stool softener -may start linzess if no bowel obstruction on xray - DG Abd 1 View; Future  2. Colon cancer screening  - Ambulatory referral to Gastroenterology   -Red flags and when to present for emergency care or RTC including fever >101.35F, chest pain, shortness of breath, new/worsening/un-resolving symptoms,  reviewed with patient at  time of visit. Follow up and care instructions discussed and provided in AVS. - I discussed the assessment and treatment plan with the patient. The patient was provided an opportunity to ask questions and all were answered. The patient agreed with the plan and demonstrated an understanding of the instructions.  I provided 15 minutes of non-face-to-face time during this encounter.  Bo Merino, FNP

## 2021-10-27 ENCOUNTER — Other Ambulatory Visit: Payer: Self-pay | Admitting: Nurse Practitioner

## 2021-10-27 DIAGNOSIS — K5904 Chronic idiopathic constipation: Secondary | ICD-10-CM

## 2021-10-27 MED ORDER — LINACLOTIDE 145 MCG PO CAPS: 145.0000 ug | ORAL_CAPSULE | Freq: Every day | ORAL | 1 refills | Status: DC

## 2021-10-28 ENCOUNTER — Telehealth: Payer: Self-pay

## 2021-10-28 NOTE — Telephone Encounter (Signed)
CALLED PATIENT NO ANSWER LEFT VOICEMAIL FOR A CALL BACK ? ?

## 2021-10-30 ENCOUNTER — Other Ambulatory Visit: Payer: Self-pay

## 2021-10-30 DIAGNOSIS — Z1211 Encounter for screening for malignant neoplasm of colon: Secondary | ICD-10-CM

## 2021-10-30 MED ORDER — PEG 3350-KCL-NA BICARB-NACL 420 G PO SOLR: 4000.0000 mL | Freq: Once | ORAL | 0 refills | Status: AC

## 2021-10-30 NOTE — Progress Notes (Signed)
Gastroenterology Pre-Procedure Review  Request Date: 11/29/2021 Requesting Physician: Dr. Tobi Bastos  PATIENT REVIEW QUESTIONS: The patient responded to the following health history questions as indicated:    1. Are you having any GI issues? no 2. Do you have a personal history of Polyps? no 3. Do you have a family history of Colon Cancer or Polyps? no 4. Diabetes Mellitus? no 5. Joint replacements in the past 12 months?no 6. Major health problems in the past 3 months?no 7. Any artificial heart valves, MVP, or defibrillator?no    MEDICATIONS & ALLERGIES:    Patient reports the following regarding taking any anticoagulation/antiplatelet therapy:   Plavix, Coumadin, Eliquis, Xarelto, Lovenox, Pradaxa, Brilinta, or Effient? no Aspirin? no  Patient confirms/reports the following medications:  Current Outpatient Medications  Medication Sig Dispense Refill   linaclotide (LINZESS) 145 MCG CAPS capsule Take 1 capsule (145 mcg total) by mouth daily before breakfast. 30 capsule 1   lisinopril (ZESTRIL) 20 MG tablet Take 1.5 tablets (30 mg total) by mouth daily. 90 tablet 3   No current facility-administered medications for this visit.    Patient confirms/reports the following allergies:  No Known Allergies  No orders of the defined types were placed in this encounter.   AUTHORIZATION INFORMATION Primary Insurance: 1D#: Group #:  Secondary Insurance: 1D#: Group #:  SCHEDULE INFORMATION: Date: 11/29/2021 Time: Location: [facility]

## 2021-11-21 ENCOUNTER — Telehealth: Payer: Self-pay | Admitting: Gastroenterology

## 2021-11-21 ENCOUNTER — Telehealth: Payer: Self-pay

## 2021-11-21 NOTE — Telephone Encounter (Signed)
Patient called in and asked me to resend instructions to his my chart  so I resent and answered all questions  ?

## 2021-11-21 NOTE — Telephone Encounter (Signed)
Pt is requesting colon prep instructions to be sent to his MY CHART ?

## 2021-11-28 ENCOUNTER — Encounter: Payer: Self-pay | Admitting: Gastroenterology

## 2021-11-29 ENCOUNTER — Ambulatory Visit
Admission: RE | Admit: 2021-11-29 | Discharge: 2021-11-29 | Disposition: A | Discharge status: Home / Self Care | Payer: BC Managed Care – PPO | Attending: Gastroenterology | Admitting: Gastroenterology

## 2021-11-29 ENCOUNTER — Ambulatory Visit: Payer: BC Managed Care – PPO | Admitting: Certified Registered Nurse Anesthetist

## 2021-11-29 ENCOUNTER — Encounter
Admission: RE | Disposition: A | Discharge status: Home / Self Care | Payer: Self-pay | Source: Home / Self Care | Attending: Gastroenterology

## 2021-11-29 DIAGNOSIS — I10 Essential (primary) hypertension: Secondary | ICD-10-CM | POA: Diagnosis not present

## 2021-11-29 DIAGNOSIS — Z1211 Encounter for screening for malignant neoplasm of colon: Secondary | ICD-10-CM | POA: Insufficient documentation

## 2021-11-29 HISTORY — PX: COLONOSCOPY WITH PROPOFOL: SHX5780

## 2021-11-29 SURGERY — COLONOSCOPY WITH PROPOFOL
Anesthesia: General

## 2021-11-29 MED ORDER — MIDAZOLAM HCL 2 MG/2ML IJ SOLN
INTRAMUSCULAR | Status: DC | PRN
Start: 2021-11-29 — End: 2021-11-29
  Administered 2021-11-29: 2 mg via INTRAVENOUS

## 2021-11-29 MED ORDER — PROPOFOL 10 MG/ML IV BOLUS
INTRAVENOUS | Status: DC | PRN
  Administered 2021-11-29 (×2): 30 mg via INTRAVENOUS
  Administered 2021-11-29: 70 mg via INTRAVENOUS

## 2021-11-29 MED ORDER — MIDAZOLAM HCL 2 MG/2ML IJ SOLN
INTRAMUSCULAR | Status: AC
  Filled 2021-11-29: qty 2

## 2021-11-29 MED ORDER — PROPOFOL 500 MG/50ML IV EMUL
INTRAVENOUS | Status: DC | PRN
  Administered 2021-11-29: 160 ug/kg/min via INTRAVENOUS

## 2021-11-29 MED ORDER — SODIUM CHLORIDE 0.9 % IV SOLN
INTRAVENOUS | Status: DC
  Administered 2021-11-29: 1000 mL via INTRAVENOUS

## 2021-11-29 NOTE — Anesthesia Procedure Notes (Signed)
Date/Time: 11/29/2021 9:43 AM ?Performed by: Demetrius Charity, CRNA ?Pre-anesthesia Checklist: Patient identified, Emergency Drugs available, Suction available, Patient being monitored and Timeout performed ?Patient Re-evaluated:Patient Re-evaluated prior to induction ?Oxygen Delivery Method: Nasal cannula ?Induction Type: IV induction ?Placement Confirmation: CO2 detector and positive ETCO2 ? ? ? ? ?

## 2021-11-29 NOTE — Op Note (Signed)
[facility] ?Gastroenterology ?Patient Name: Alan Mathews ?Procedure Date: 11/29/2021 9:26 AM ?MRN: [id number] ?Account #: [id number] ?Date of Birth: December 29, 1970 ?Admit Type: Outpatient ?Age: 51 ?Room: [facility]O ROOM 4 ?Gender: Male ?Note Status: Finalized ?Instrument Name: Colonoscope [id number] ?Procedure:             Colonoscopy ?Indications:           Screening for colorectal malignant neoplasm ?Providers:             [name] MD, MD ?Referring MD:          Forest Gleason Md, MD (Referring MD) ?Medicines:             Monitored Anesthesia Care ?Complications:         No immediate complications. ?Procedure:             Pre-Anesthesia Assessment: ?                       - Prior to the procedure, a History and Physical was  ?                       performed, and patient medications, allergies and  ?                       sensitivities were reviewed. The patient's tolerance  ?                       of previous anesthesia was reviewed. ?                       - The risks and benefits of the procedure and the  ?                       sedation options and risks were discussed with the  ?                       patient. All questions were answered and informed  ?                       consent was obtained. ?                       - ASA Grade Assessment: II - A patient with mild  ?                       systemic disease. ?                       After obtaining informed consent, the colonoscope was  ?                       passed under direct vision. Throughout the procedure,  ?                       the patient's blood pressure, pulse, and oxygen  ?                       saturations were monitored continuously. The  ?                       Colonoscope was  introduced through the anus and  ?                       advanced to the the cecum, identified by the  ?                       appendiceal orifice. The colonoscopy was performed  ?                       with ease. The patient tolerated the procedure well.  ?                        The quality of the bowel preparation was inadequate. ?Findings: ?     The perianal and digital rectal examinations were normal. ?     A large amount of semi-liquid stool was found in the entire colon,  ?     interfering with visualization. ?Impression:            - Preparation of the colon was inadequate. ?                       - Stool in the entire examined colon. ?                       - No specimens collected. ?Recommendation:        - Discharge patient to home (with escort). ?                       - Resume previous diet. ?                       - Continue present medications. ?                       - Repeat colonoscopy in 4 weeks because the bowel  ?                       preparation was suboptimal. ?Procedure Code(s):     --- Professional --- ?                       [phone], Colonoscopy, flexible; diagnostic, including  ?                       collection of specimen(s) by brushing or washing, when  ?                       performed (separate procedure) ?Diagnosis Code(s):     --- Professional --- ?                       Z12.11, Encounter for screening for malignant neoplasm  ?                       of colon ?CPT copyright 2019 [employer]. All rights reserved. ?The codes documented in this report are preliminary and upon coder review may  ?be revised to meet current compliance requirements. ?[name], MD ?[name] MD, MD ?11/29/2021 9:53:49 AM ?This report has been signed electronically. ?Number of Addenda: 0 ?Note Initiated On: 11/29/2021 9:26 AM ?Scope Withdrawal Time: 0 hours 4 minutes  33 seconds  ?Total Procedure Duration: 0 hours 7 minutes 10 seconds  ?Estimated Blood Loss:  Estimated blood loss: none. ?     [facility] ?

## 2021-11-29 NOTE — H&P (Signed)
? ? ? ?  [name], MD ?[address], [place], [place], [place], [address] ?[address], [place], [place], [place], [address] ?Phone: [phone]  ?Fax: [phone] ? ?Primary Care Physician:  Bo Merino, FNP ? ? ?Pre-Procedure History & Physical: ?HPI:  Alan Mathews is a 51 y.o. male is here for an colonoscopy. ?  ?Past Medical History:  ?Diagnosis Date  ? Hypertension   ? ? ?Past Surgical History:  ?Procedure Laterality Date  ? TONSILLECTOMY AND ADENOIDECTOMY  04/03/2010  ? ? ?Prior to Admission medications   ?Medication Sig Start Date End Date Taking? Authorizing Provider  ?linaclotide Rolan Lipa) 145 MCG CAPS capsule Take 1 capsule (145 mcg total) by mouth daily before breakfast. 10/27/21   Bo Merino, FNP  ?lisinopril (ZESTRIL) 20 MG tablet Take 1.5 tablets (30 mg total) by mouth daily. 07/01/21   Bo Merino, FNP  ? ? ?Allergies as of 10/30/2021  ? (No Known Allergies)  ? ? ?Family History  ?Problem Relation Age of Onset  ? Hypertension Mother   ? Hypertension Father   ? Hypertension Brother   ? Aneurysm Maternal Grandmother   ? ? ?Social History  ? ?Socioeconomic History  ? Marital status: Single  ?  Spouse name: Not on file  ? Number of children: Not on file  ? Years of education: Not on file  ? Highest education level: Not on file  ?Occupational History  ? Not on file  ?Tobacco Use  ? Smoking status: Never  ? Smokeless tobacco: Never  ?Vaping Use  ? Vaping Use: Never used  ?Substance and Sexual Activity  ? Alcohol use: No  ? Drug use: No  ? Sexual activity: Yes  ?  Birth control/protection: None  ?Other Topics Concern  ? Not on file  ?Social History Narrative  ? Not on file  ? ?Social Determinants of Health  ? ?Financial Resource Strain: Not on file  ?Food Insecurity: Not on file  ?Transportation Needs: Not on file  ?Physical Activity: Not on file  ?Stress: Not on file  ?Social Connections: Not on file  ?Intimate Partner Violence: Not on file  ? ? ?Review of Systems: ?See HPI, otherwise  negative ROS ? ?Physical Exam: ?There were no vitals taken for this visit. ?General:   Alert,  pleasant and cooperative in NAD ?Head:  Normocephalic and atraumatic. ?Neck:  Supple; no masses or thyromegaly. ?Lungs:  Clear throughout to auscultation, normal respiratory effort.    ?Heart:  +S1, +S2, Regular rate and rhythm, No edema. ?Abdomen:  Soft, nontender and nondistended. Normal bowel sounds, without guarding, and without rebound.   ?Neurologic:  Alert and  oriented x4;  grossly normal neurologically. ? ?Impression/Plan: ?[name] is here for an colonoscopy to be performed for Screening colonoscopy average risk   ?Risks, benefits, limitations, and alternatives regarding  colonoscopy have been reviewed with the patient.  Questions have been answered.  All parties agreeable. ? ? ?[name], MD  11/29/2021, 9:08 AM ? ?

## 2021-11-29 NOTE — Anesthesia Preprocedure Evaluation (Addendum)
Anesthesia Evaluation  ?Patient identified by MRN, date of birth, ID band ?Patient awake ? ? ? ?Reviewed: ?Allergy & Precautions, NPO status , Patient's Chart, lab work & pertinent test results ? ?History of Anesthesia Complications ?Negative for: history of anesthetic complications ? ?Airway ?Mallampati: IV ? ? ?Neck ROM: Full ? ? ? Dental ?no notable dental hx. ? ?  ?Pulmonary ?neg pulmonary ROS,  ?  ?Pulmonary exam normal ?breath sounds clear to auscultation ? ? ? ? ? ? Cardiovascular ?hypertension, Normal cardiovascular exam ?Rhythm:Regular Rate:Normal ? ? ?  ?Neuro/Psych ?PSYCHIATRIC DISORDERS Anxiety negative neurological ROS ?   ? GI/Hepatic ?GERD  ,  ?Endo/Other  ?negative endocrine ROS ? Renal/GU ?negative Renal ROS  ? ?  ?Musculoskeletal ? ? Abdominal ?  ?Peds ? Hematology ?negative hematology ROS ?(+)   ?Anesthesia Other Findings ? ? Reproductive/Obstetrics ? ?  ? ? ? ? ? ? ? ? ? ? ? ? ? ?  ?  ? ? ? ? ? ? ? ?Anesthesia Physical ?Anesthesia Plan ? ?ASA: 2 ? ?Anesthesia Plan: General  ? ?Post-op Pain Management:   ? ?Induction: Intravenous ? ?PONV Risk Score and Plan: 2 and Propofol infusion, TIVA and Treatment may vary due to age or medical condition ? ?Airway Management Planned: Natural Airway ? ?Additional Equipment:  ? ?Intra-op Plan:  ? ?Post-operative Plan:  ? ?Informed Consent: I have reviewed the patients History and Physical, chart, labs and discussed the procedure including the risks, benefits and alternatives for the proposed anesthesia with the patient or authorized representative who has indicated his/her understanding and acceptance.  ? ? ? ? ? ?Plan Discussed with: CRNA ? ?Anesthesia Plan Comments: (LMA/GETA backup discussed.  Patient consented for risks of anesthesia including but not limited to:  ?- adverse reactions to medications ?- damage to eyes, teeth, lips or other oral mucosa ?- nerve damage due to positioning  ?- sore throat or hoarseness ?- damage  to heart, brain, nerves, lungs, other parts of body or loss of life ? ?Informed patient about role of CRNA in peri- and intra-operative care.  Patient voiced understanding.)  ? ? ? ? ? ? ?Anesthesia Quick Evaluation ? ?

## 2021-11-29 NOTE — Transfer of Care (Signed)
Immediate Anesthesia Transfer of Care Note ? ?Patient: Alan Mathews ? ?Procedure(s) Performed: COLONOSCOPY WITH PROPOFOL ? ?Patient Location: PACU ? ?Anesthesia Type:General ? ?Level of Consciousness: drowsy ? ?Airway & Oxygen Therapy: Patient Spontanous Breathing ? ?Post-op Assessment: Report given to RN and Post -op Vital signs reviewed and stable ? ?Post vital signs: Reviewed and stable ? ?Last Vitals:  ?Vitals Value Taken Time  ?BP    ?Temp    ?Pulse    ?Resp    ?SpO2    ? ? ?Last Pain:  ?Vitals:  ? 11/29/21 0908  ?TempSrc: Tympanic  ?PainSc: 0-No pain  ?   ? ?  ? ?Complications: No notable events documented. ?

## 2021-11-29 NOTE — Anesthesia Postprocedure Evaluation (Signed)
Anesthesia Post Note ? ?Patient: [name] ? ?Procedure(s) Performed: COLONOSCOPY WITH PROPOFOL ? ?Patient location during evaluation: PACU ?Anesthesia Type: General ?Level of consciousness: awake and alert, oriented and patient cooperative ?Pain management: pain level controlled ?Vital Signs Assessment: post-procedure vital signs reviewed and stable ?Respiratory status: spontaneous breathing, nonlabored ventilation and respiratory function stable ?Cardiovascular status: blood pressure returned to baseline and stable ?Postop Assessment: adequate PO intake ?Anesthetic complications: no ? ? ?No notable events documented. ? ? ?Last Vitals:  ?Vitals:  ? 11/29/21 1005 11/29/21 1015  ?BP: (!) 144/89 (!) 169/93  ?Pulse: (!) 58 (!) 55  ?Resp: 18 14  ?Temp:    ?SpO2: 99% 100%  ?  ?Last Pain:  ?Vitals:  ? 11/29/21 1015  ?TempSrc:   ?PainSc: 0-No pain  ? ? ?  ?  ?  ?  ?  ?  ? ?Reed Breech ? ? ? ? ?

## 2021-12-02 ENCOUNTER — Encounter: Payer: Self-pay | Admitting: Gastroenterology

## 2021-12-06 ENCOUNTER — Ambulatory Visit (INDEPENDENT_AMBULATORY_CARE_PROVIDER_SITE_OTHER): Payer: BC Managed Care – PPO | Admitting: Nurse Practitioner

## 2021-12-06 ENCOUNTER — Encounter: Payer: Self-pay | Admitting: Nurse Practitioner

## 2021-12-06 VITALS — BP 132/68 | HR 76 | Temp 98.5°F | Resp 16 | Ht 66.0 in | Wt 173.0 lb

## 2021-12-06 DIAGNOSIS — J069 Acute upper respiratory infection, unspecified: Secondary | ICD-10-CM

## 2021-12-06 LAB — POCT INFLUENZA A/B
Influenza A, POC: NEGATIVE
Influenza B, POC: NEGATIVE

## 2021-12-06 MED ORDER — BENZONATATE 100 MG PO CAPS: 100.0000 mg | ORAL_CAPSULE | Freq: Three times a day (TID) | ORAL | 0 refills | Status: DC | PRN

## 2021-12-06 NOTE — Progress Notes (Signed)
? ?BP 132/68   Pulse 76   Temp 98.5 ?F (36.9 ?C) (Oral)   Resp 16   Ht 5' 6"  (1.676 m)   Wt 173 lb (78.5 kg)   SpO2 98%   BMI 27.92 kg/m?   ? ?Subjective:  ? ? Patient ID: Alan Mathews, male    DOB: 1970/09/21, 51 y.o.   MRN: [id number] ? ?HPI: ?Alan Mathews is a 51 y.o. male ? ?Chief Complaint  ?Patient presents with  ? bodyaches  ?  X3 days  ? Cough  ?  Getting better  ? Headache  ?  Has this morning  ? ?URI: He says symptoms started Wednesday.  His symptoms include runny nose, headache, body aches and cough. He says he does have a productive cough.  He says a couple of coworkers have been sick but he doesn't know with what.  He denies any fever, sore throat, nasal congestion or shortness of breath. He has taken some extra strength tylenol to help with symptoms, he says he was able to get some sleep. He is within the treatment window for covid, discussed treatment and he would like to do that if he is positive.  Will get flu and covid test today.  Discussed starting mucinex.  Will send in tessalon perls for cough.  Discussed pushing fluids and getting rest. Follow up if no improvement.  ? ?Relevant past medical, surgical, family and social history reviewed and updated as indicated. Interim medical history since our last visit reviewed. ?Allergies and medications reviewed and updated. ? ?Review of Systems ? ?Constitutional: Negative for fever or weight change.  ?HEENT: positive for runny nose, negative for sore throat ?Respiratory:positive for cough and negative for shortness of breath.   ?Cardiovascular: Negative for chest pain or palpitations.  ?Gastrointestinal: Negative for abdominal pain, no bowel changes.  ?Musculoskeletal: Negative for gait problem or joint swelling.  ?Skin: Negative for rash.  ?Neurological: Negative for dizziness, positive headache.  ?No other specific complaints in a complete review of systems (except as listed in HPI above).  ? ?   ?Objective:  ?  ?BP 132/68   Pulse 76   Temp  98.5 ?F (36.9 ?C) (Oral)   Resp 16   Ht 5' 6"  (1.676 m)   Wt 173 lb (78.5 kg)   SpO2 98%   BMI 27.92 kg/m?   ?Wt Readings from Last 3 Encounters:  ?12/06/21 173 lb (78.5 kg)  ?11/29/21 173 lb (78.5 kg)  ?07/01/21 171 lb 11.2 oz (77.9 kg)  ?  ?Physical Exam ? ?Constitutional: Patient appears well-developed and well-nourished.  No distress.  ?HEENT: head atraumatic, normocephalic, pupils equal and reactive to light, ears TMs clear, neck supple, throat within normal limits ?Cardiovascular: Normal rate, regular rhythm and normal heart sounds.  No murmur heard. No BLE edema. ?Pulmonary/Chest: Effort normal and breath sounds normal. No respiratory distress. ?Abdominal: Soft.  There is no tenderness. ?Psychiatric: Patient has a normal mood and affect. behavior is normal. Judgment and thought content normal.  ? ?Results for orders placed or performed in visit on 07/01/21  ?CBC with Differential/Platelet  ?Result Value Ref Range  ? WBC 7.8 3.8 - 10.8 Thousand/uL  ? RBC 5.01 4.20 - 5.80 Million/uL  ? Hemoglobin 14.6 13.2 - 17.1 g/dL  ? HCT 43.3 38.5 - 50.0 %  ? MCV 86.4 80.0 - 100.0 fL  ? MCH 29.1 27.0 - 33.0 pg  ? MCHC 33.7 32.0 - 36.0 g/dL  ? RDW 14.2 11.0 - 15.0 %  ?  Platelets 245 140 - 400 Thousand/uL  ? MPV 11.2 7.5 - 12.5 fL  ? Neutro Abs 5,343 1,500 - 7,800 cells/uL  ? Lymphs Abs 1,716 850 - 3,900 cells/uL  ? Absolute Monocytes 624 200 - 950 cells/uL  ? Eosinophils Absolute 70 15 - 500 cells/uL  ? Basophils Absolute 47 0 - 200 cells/uL  ? Neutrophils Relative % 68.5 %  ? Total Lymphocyte 22.0 %  ? Monocytes Relative 8.0 %  ? Eosinophils Relative 0.9 %  ? Basophils Relative 0.6 %  ?COMPLETE METABOLIC PANEL WITH GFR  ?Result Value Ref Range  ? Glucose, Bld 89 65 - 99 mg/dL  ? BUN 22 7 - 25 mg/dL  ? Creat 1.12 0.70 - 1.30 mg/dL  ? eGFR 80 > OR = 60 mL/min/1.50m  ? BUN/Creatinine Ratio NOT APPLICABLE 6 - 22 (calc)  ? Sodium 141 135 - 146 mmol/L  ? Potassium 5.2 3.5 - 5.3 mmol/L  ? Chloride 104 98 - 110 mmol/L  ? CO2  30 20 - 32 mmol/L  ? Calcium 9.8 8.6 - 10.3 mg/dL  ? Total Protein 6.5 6.1 - 8.1 g/dL  ? Albumin 4.2 3.6 - 5.1 g/dL  ? Globulin 2.3 1.9 - 3.7 g/dL (calc)  ? AG Ratio 1.8 1.0 - 2.5 (calc)  ? Total Bilirubin 0.3 0.2 - 1.2 mg/dL  ? Alkaline phosphatase (APISO) 82 35 - 144 U/L  ? AST 15 10 - 35 U/L  ? ALT 15 9 - 46 U/L  ?Lipid panel  ?Result Value Ref Range  ? Cholesterol 165 <200 mg/dL  ? HDL 65 > OR = 40 mg/dL  ? Triglycerides 56 <150 mg/dL  ? LDL Cholesterol (Calc) 86 mg/dL (calc)  ? Total CHOL/HDL Ratio 2.5 <5.0 (calc)  ? Non-HDL Cholesterol (Calc) 100 <130 mg/dL (calc)  ?Hemoglobin A1c  ?Result Value Ref Range  ? Hgb A1c MFr Bld 5.4 <5.7 % of total Hgb  ? Mean Plasma Glucose 108 mg/dL  ? eAG (mmol/L) 6.0 mmol/L  ? ?   ?Assessment & Plan:  ? ?1. Viral upper respiratory tract infection ?-push fluids, get rest ?-take mucinex ?- POCT Influenza A/B ?- Novel Coronavirus, NAA ([facility]) ?- benzonatate (TESSALON) 100 MG capsule; Take 1 capsule (100 mg total) by mouth 3 (three) times daily as needed for cough.  Dispense: 30 capsule; Refill: 0  ? ?Follow up plan: ?Return if symptoms worsen or fail to improve. ? ? ? ? ? ?

## 2021-12-09 DIAGNOSIS — J069 Acute upper respiratory infection, unspecified: Secondary | ICD-10-CM | POA: Diagnosis not present

## 2021-12-10 LAB — NOVEL CORONAVIRUS, NAA: SARS-CoV-2, NAA: NOT DETECTED

## 2021-12-10 LAB — SPECIMEN STATUS REPORT

## 2021-12-17 ENCOUNTER — Telehealth: Payer: Self-pay | Admitting: Emergency Medicine

## 2021-12-17 NOTE — Telephone Encounter (Signed)
Patient stated that he was schedule for Colonoscopy and Doctor told him he was not fully clean and procedure was cancelled and he has to reschedule ?

## 2021-12-28 ENCOUNTER — Other Ambulatory Visit: Payer: Self-pay | Admitting: Nurse Practitioner

## 2021-12-28 DIAGNOSIS — K5904 Chronic idiopathic constipation: Secondary | ICD-10-CM

## 2021-12-30 NOTE — Telephone Encounter (Signed)
Requested Prescriptions  ?Pending Prescriptions Disp Refills  ?? LINZESS 145 MCG CAPS capsule [Pharmacy Med Name: LINZESS 145 MCG CAPSULE] 30 capsule 1  ?  Sig: TAKE 1 CAPSULE BY MOUTH DAILY BEFORE BREAKFAST.  ?  ? Gastroenterology: Irritable Bowel Syndrome Passed - 12/28/2021 10:06 AM  ?  ?  Passed - Valid encounter within last 12 months  ?  Recent Outpatient Visits   ?      ? 3 weeks ago Viral upper respiratory tract infection  ? [facility] Della Goo F, FNP  ? 2 months ago Constipation, unspecified constipation type  ? [facility] Della Goo F, FNP  ? 6 months ago Essential hypertension  ? [facility] Della Goo F, FNP  ? 1 year ago Essential hypertension  ? [facility], Azalee Course, FNP  ? 1 year ago Essential hypertension  ? [facility]ense M, DO  ?  ?  ?Future Appointments   ?        ? In 4 days Berniece Salines, FNP [facility]  ?  ? ?  ?  ?  ? ?

## 2022-01-03 ENCOUNTER — Ambulatory Visit: Payer: BC Managed Care – PPO | Admitting: Nurse Practitioner

## 2022-01-03 NOTE — Progress Notes (Deleted)
   There were no vitals taken for this visit.   Subjective:    Patient ID: Alan Mathews, male    DOB: 10/23/1970, 51 y.o.   MRN: [id number]  HPI: Alan Mathews is a 51 y.o. male  No chief complaint on file.  Hypertension: His blood pressure today is ***.    Hyperlipidemia: His last LDL was 86 on 07/01/2021, prior to that it was 110.  He is not currently on medication.  He was on Lipitor in 2019 but he stopped taking it he has been controlling his cholesterol with diet.  We will get labs today.  Relevant past medical, surgical, family and social history reviewed and updated as indicated. Interim medical history since our last visit reviewed. Allergies and medications reviewed and updated.  Review of Systems  Per HPI unless specifically indicated above     Objective:    There were no vitals taken for this visit.  Wt Readings from Last 3 Encounters:  12/06/21 173 lb (78.5 kg)  11/29/21 173 lb (78.5 kg)  07/01/21 171 lb 11.2 oz (77.9 kg)    Physical Exam  Results for orders placed or performed in visit on 12/06/21  Novel Coronavirus, NAA ([facility])   Specimen: Nasopharyngeal(NP) swabs in vial transport medium   Nasopharynge  Previous  Result Value Ref Range   SARS-CoV-2, NAA Not Detected Not Detected  Specimen status report  Result Value Ref Range   specimen status report Comment   POCT Influenza A/B  Result Value Ref Range   Influenza A, POC Negative Negative   Influenza B, POC Negative Negative      Assessment & Plan:   Problem List Items Addressed This Visit       Cardiovascular and Mediastinum   Essential hypertension - Primary     Other   Hyperlipidemia     Follow up plan: No follow-ups on file.

## 2022-01-30 ENCOUNTER — Encounter: Payer: Self-pay | Admitting: Nurse Practitioner

## 2022-01-30 ENCOUNTER — Ambulatory Visit (INDEPENDENT_AMBULATORY_CARE_PROVIDER_SITE_OTHER): Payer: BC Managed Care – PPO | Admitting: Nurse Practitioner

## 2022-01-30 ENCOUNTER — Other Ambulatory Visit: Payer: Self-pay

## 2022-01-30 VITALS — BP 160/90 | HR 71 | Temp 98.5°F | Resp 16 | Ht 66.0 in | Wt 176.7 lb

## 2022-01-30 DIAGNOSIS — Z131 Encounter for screening for diabetes mellitus: Secondary | ICD-10-CM

## 2022-01-30 DIAGNOSIS — I10 Essential (primary) hypertension: Secondary | ICD-10-CM | POA: Diagnosis not present

## 2022-01-30 DIAGNOSIS — E785 Hyperlipidemia, unspecified: Secondary | ICD-10-CM

## 2022-01-30 DIAGNOSIS — K5904 Chronic idiopathic constipation: Secondary | ICD-10-CM | POA: Insufficient documentation

## 2022-01-30 MED ORDER — LINACLOTIDE 290 MCG PO CAPS: ORAL_CAPSULE | ORAL | 1 refills | Status: DC

## 2022-01-30 NOTE — Assessment & Plan Note (Signed)
ASCVD risk score elevated due to blood pressure.  Discussed with patient that it is recommended that he start cholesterol medication.  He would like to hold off and see if he can improve his rescore by decreasing his blood pressure by taking his medication.

## 2022-01-30 NOTE — Assessment & Plan Note (Signed)
Patient has not been taking his blood pressure medication.  Discussed risks associated with uncontrolled hypertension.  Patient is in agreement to start taking his medication.  He is going to take lisinopril 30 mg daily.

## 2022-01-30 NOTE — Progress Notes (Signed)
BP (!) 160/90   Pulse 71   Temp 98.5 F (36.9 C) (Oral)   Resp 16   Ht 5\' 6"  (1.676 m)   Wt 176 lb 11.2 oz (80.2 kg)   SpO2 96%   BMI 28.52 kg/m    Subjective:    Patient ID: Alan Mathews, male    DOB: 1970-10-29, 51 y.o.   MRN: [id number]  HPI: Alan Mathews is a 51 y.o. male  Chief Complaint  Patient presents with   Hypertension    6 month follow up   Hypertension: Patient is prescribed lisinopril 30 mg daily, he reports he has not been taking it.  He says he just stopped taking his medicine.  His blood pressure today is 150/86, retake was 160/90. He does not check his blood pressure at home.   He denies any chest pain, shortness of breath, headaches or blurred vision.  Discussed the complications of uncontrolled hypertension.  Patient is in agreement to start taking his blood pressure medication.   Hyperlipidemia: Patient is not currently on any cholesterol medication.  His last LDL was 86 on 07/01/2021. He says he has not been eating well lately. He says that it is probably high again. We will get labs.  Discussed that his ASCVD score is elevated.  Discussed with patient that we may need to start treatment for his cholesterol If he does not get his blood pressure under control.  Patient understands the risks.  Patient reports he is going to start taking his blood pressure medication.  Going to get labs today.  The 10-year ASCVD risk score (Arnett DK, et al., 2019) is: 12.1%   Values used to calculate the score:     Age: 1 years     Sex: Male     Is Non-Hispanic African American: Yes     Diabetic: No     Tobacco smoker: No     Systolic Blood Pressure: 160 mmHg     Is BP treated: Yes     HDL Cholesterol: 65 mg/dL     Total Cholesterol: 165 mg/dL   Chronic constipation: Patient has had chronic constipation for a while.  Started on Linzess on 10/27/2021. He says that he has had a couple episodes of constipation since being on linzess.  Discussed increasing dose.  Patient was  also scheduled for colonoscopy.  He says they tried to do the colonoscopy but he was not cleared out.  Is going to call and reschedule for another one.  Relevant past medical, surgical, family and social history reviewed and updated as indicated. Interim medical history since our last visit reviewed. Allergies and medications reviewed and updated.  Review of Systems  Constitutional: Negative for fever or weight change.  Respiratory: Negative for cough and shortness of breath.   Cardiovascular: Negative for chest pain or palpitations.  Gastrointestinal: Negative for abdominal pain, no bowel changes.  Musculoskeletal: Negative for gait problem or joint swelling.  Skin: Negative for rash.  Neurological: Negative for dizziness or headache.  No other specific complaints in a complete review of systems (except as listed in HPI above).      Objective:    BP (!) 160/90   Pulse 71   Temp 98.5 F (36.9 C) (Oral)   Resp 16   Ht 5\' 6"  (1.676 m)   Wt 176 lb 11.2 oz (80.2 kg)   SpO2 96%   BMI 28.52 kg/m   Wt Readings from Last 3 Encounters:  01/30/22 176 lb  11.2 oz (80.2 kg)  12/06/21 173 lb (78.5 kg)  11/29/21 173 lb (78.5 kg)    Physical Exam  Constitutional: Patient appears well-developed and well-nourished. Obese  No distress.  HEENT: head atraumatic, normocephalic, pupils equal and reactive to light, neck supple Cardiovascular: Normal rate, regular rhythm and normal heart sounds.  No murmur heard. No BLE edema. Pulmonary/Chest: Effort normal and breath sounds normal. No respiratory distress. Abdominal: Soft.  There is no tenderness. Psychiatric: Patient has a normal mood and affect. behavior is normal. Judgment and thought content normal.  Results for orders placed or performed in visit on 12/06/21  Novel Coronavirus, NAA ([facility])   Specimen: Nasopharyngeal(NP) swabs in vial transport medium   Nasopharynge  Previous  Result Value Ref Range   SARS-CoV-2, NAA Not Detected Not  Detected  Specimen status report  Result Value Ref Range   specimen status report Comment   POCT Influenza A/B  Result Value Ref Range   Influenza A, POC Negative Negative   Influenza B, POC Negative Negative      Assessment & Plan:   Problem List Items Addressed This Visit       Cardiovascular and Mediastinum   Essential hypertension - Primary    Patient has not been taking his blood pressure medication.  Discussed risks associated with uncontrolled hypertension.  Patient is in agreement to start taking his medication.  He is going to take lisinopril 30 mg daily.       Relevant Orders   CBC with Differential/Platelet   COMPLETE METABOLIC PANEL WITH GFR     Digestive   Chronic idiopathic constipation    Patient reports he still having episodes of constipation.  Increase dose of Linzess to 290 mcg daily.       Relevant Medications   linaclotide (LINZESS) 290 MCG CAPS capsule     Other   Hyperlipidemia    ASCVD risk score elevated due to blood pressure.  Discussed with patient that it is recommended that he start cholesterol medication.  He would like to hold off and see if he can improve his rescore by decreasing his blood pressure by taking his medication.       Relevant Orders   Lipid panel   Other Visit Diagnoses     Screening for diabetes mellitus       Relevant Orders   Hemoglobin A1c        Follow up plan: Return in about 4 months (around 06/02/2022) for follow up.

## 2022-01-30 NOTE — Assessment & Plan Note (Signed)
Patient reports he still having episodes of constipation.  Increase dose of Linzess to 290 mcg daily.

## 2022-01-31 LAB — COMPLETE METABOLIC PANEL WITH GFR
AG Ratio: 1.7 (calc) (ref 1.0–2.5)
ALT: 13 U/L (ref 9–46)
AST: 14 U/L (ref 10–35)
Albumin: 4.5 g/dL (ref 3.6–5.1)
Alkaline phosphatase (APISO): 91 U/L (ref 35–144)
BUN: 16 mg/dL (ref 7–25)
CO2: 23 mmol/L (ref 20–32)
Calcium: 9.5 mg/dL (ref 8.6–10.3)
Chloride: 108 mmol/L (ref 98–110)
Creat: 0.94 mg/dL (ref 0.70–1.30)
Globulin: 2.6 g/dL (calc) (ref 1.9–3.7)
Glucose, Bld: 88 mg/dL (ref 65–99)
Potassium: 4.8 mmol/L (ref 3.5–5.3)
Sodium: 140 mmol/L (ref 135–146)
Total Bilirubin: 0.3 mg/dL (ref 0.2–1.2)
Total Protein: 7.1 g/dL (ref 6.1–8.1)
eGFR: 98 mL/min/{1.73_m2} (ref >=60)

## 2022-01-31 LAB — CBC WITH DIFFERENTIAL/PLATELET
Absolute Monocytes: 605 cells/uL (ref 200–950)
Basophils Absolute: 63 cells/uL (ref 0–200)
Basophils Relative: 1 %
Eosinophils Absolute: 113 cells/uL (ref 15–500)
Eosinophils Relative: 1.8 %
HCT: 46.9 % (ref 38.5–50.0)
Hemoglobin: 15.6 g/dL (ref 13.2–17.1)
Lymphs Abs: 1556 cells/uL (ref 850–3900)
MCH: 28.3 pg (ref 27.0–33.0)
MCHC: 33.3 g/dL (ref 32.0–36.0)
MCV: 85 fL (ref 80.0–100.0)
MPV: 11.3 fL (ref 7.5–12.5)
Monocytes Relative: 9.6 %
Neutro Abs: 3963 cells/uL (ref 1500–7800)
Neutrophils Relative %: 62.9 %
Platelets: 267 10*3/uL (ref 140–400)
RBC: 5.52 10*6/uL (ref 4.20–5.80)
RDW: 13.9 % (ref 11.0–15.0)
Total Lymphocyte: 24.7 %
WBC: 6.3 10*3/uL (ref 3.8–10.8)

## 2022-01-31 LAB — HEMOGLOBIN A1C
Hgb A1c MFr Bld: 5.4 % of total Hgb (ref <5.7)
Mean Plasma Glucose: 108 mg/dL
eAG (mmol/L): 6 mmol/L

## 2022-01-31 LAB — LIPID PANEL
Cholesterol: 165 mg/dL (ref <200)
HDL: 66 mg/dL (ref >=40)
LDL Cholesterol (Calc): 84 mg/dL (calc)
Non-HDL Cholesterol (Calc): 99 mg/dL (calc) (ref <130)
Total CHOL/HDL Ratio: 2.5 (calc) (ref <5.0)
Triglycerides: 70 mg/dL (ref <150)

## 2022-03-05 ENCOUNTER — Ambulatory Visit (INDEPENDENT_AMBULATORY_CARE_PROVIDER_SITE_OTHER): Payer: BC Managed Care – PPO | Admitting: Nurse Practitioner

## 2022-03-05 ENCOUNTER — Encounter: Payer: Self-pay | Admitting: Nurse Practitioner

## 2022-03-05 ENCOUNTER — Other Ambulatory Visit: Payer: Self-pay

## 2022-03-05 VITALS — BP 160/84 | HR 65 | Temp 98.6°F | Resp 16 | Ht 66.0 in | Wt 179.3 lb

## 2022-03-05 DIAGNOSIS — I10 Essential (primary) hypertension: Secondary | ICD-10-CM | POA: Diagnosis not present

## 2022-03-05 DIAGNOSIS — R21 Rash and other nonspecific skin eruption: Secondary | ICD-10-CM

## 2022-03-05 MED ORDER — HYDROCHLOROTHIAZIDE 12.5 MG PO TABS: 12.5000 mg | ORAL_TABLET | Freq: Every day | ORAL | 0 refills | Status: DC

## 2022-03-05 NOTE — Assessment & Plan Note (Signed)
Continue taking lisinopril 20 mg daily add hydrochlorothiazide 12.5 mg daily.

## 2022-03-05 NOTE — Progress Notes (Signed)
BP (!) 160/84   Pulse 65   Temp 98.6 F (37 C) (Oral)   Resp 16   Ht 5' 6" (1.676 m)   Wt 179 lb 4.8 oz (81.3 kg)   SpO2 98%   BMI 28.94 kg/m    Subjective:    Patient ID: Alan Mathews, male    DOB: 02/28/1971, 51 y.o.   MRN: [id number]  HPI: [name] is a 51 y.o. male  Chief Complaint  Patient presents with   Hypertension   Rash    On face on and off for a year   Hypertension: Pt was last seen on 01/30/2022.  His blood pressure was 150/86 and retake was 160/90.  Patient stated he had not been taking his blood pressure medication. He was prescribed lisinopril 30 mg daily.  Discussed with patient that risks associated with uncontrolled hypertension. Patient stated he would start to take his medication as prescribed.  Patient states he has been taking his lisinopril but has only been taking 20 mg daily.  His blood pressure today is 160/84.  He does not check his blood pressure at home.  Patient denies any chest pain, shortness of breath, headaches or blurred vision.  Discussed with patient that would be adding a medication.  We can add on hydrochlorothiazide 12.5 mg daily.  Rash: Patient has a non-itchy rash to his right side of his face.  Patient states he has had this off and on for a year.  Recommend patient apply Eucerin or Aquaphor emollients to dry areas on face.  Patient is in agreement with plan.  Relevant past medical, surgical, family and social history reviewed and updated as indicated. Interim medical history since our last visit reviewed. Allergies and medications reviewed and updated.  Review of Systems  Constitutional: Negative for fever or weight change.  Respiratory: Negative for cough and shortness of breath.   Cardiovascular: Negative for chest pain or palpitations.  Gastrointestinal: Negative for abdominal pain, no bowel changes.  Musculoskeletal: Negative for gait problem or joint swelling.  Skin: Positive for rash.  Neurological: Negative for  dizziness or headache.  No other specific complaints in a complete review of systems (except as listed in HPI above).      Objective:    BP (!) 160/84   Pulse 65   Temp 98.6 F (37 C) (Oral)   Resp 16   Ht 5' 6" (1.676 m)   Wt 179 lb 4.8 oz (81.3 kg)   SpO2 98%   BMI 28.94 kg/m   Wt Readings from Last 3 Encounters:  03/05/22 179 lb 4.8 oz (81.3 kg)  01/30/22 176 lb 11.2 oz (80.2 kg)  12/06/21 173 lb (78.5 kg)    Physical Exam  Constitutional: Patient appears well-developed and well-nourished. Obese  No distress.  HEENT: head atraumatic, normocephalic, pupils equal and reactive to light,  neck supple Cardiovascular: Normal rate, regular rhythm and normal heart sounds.  No murmur heard. No BLE edema. Pulmonary/Chest: Effort normal and breath sounds normal. No respiratory distress. Abdominal: Soft.  There is no tenderness. Skin: dry patch noted to right side of patient face Psychiatric: Patient has a normal mood and affect. behavior is normal. Judgment and thought content normal.  Results for orders placed or performed in visit on 01/30/22  Lipid panel  Result Value Ref Range   Cholesterol 165 <200 mg/dL   HDL 66 > OR = 40 mg/dL   Triglycerides 70 <150 mg/dL   LDL Cholesterol (Calc) 84 mg/dL (  calc)   Total CHOL/HDL Ratio 2.5 <5.0 (calc)   Non-HDL Cholesterol (Calc) 99 <130 mg/dL (calc)  CBC with Differential/Platelet  Result Value Ref Range   WBC 6.3 3.8 - 10.8 Thousand/uL   RBC 5.52 4.20 - 5.80 Million/uL   Hemoglobin 15.6 13.2 - 17.1 g/dL   HCT 46.9 38.5 - 50.0 %   MCV 85.0 80.0 - 100.0 fL   MCH 28.3 27.0 - 33.0 pg   MCHC 33.3 32.0 - 36.0 g/dL   RDW 13.9 11.0 - 15.0 %   Platelets 267 140 - 400 Thousand/uL   MPV 11.3 7.5 - 12.5 fL   Neutro Abs 3,963 1,500 - 7,800 cells/uL   Lymphs Abs 1,556 850 - 3,900 cells/uL   Absolute Monocytes 605 200 - 950 cells/uL   Eosinophils Absolute 113 15 - 500 cells/uL   Basophils Absolute 63 0 - 200 cells/uL   Neutrophils Relative %  62.9 %   Total Lymphocyte 24.7 %   Monocytes Relative 9.6 %   Eosinophils Relative 1.8 %   Basophils Relative 1.0 %  COMPLETE METABOLIC PANEL WITH GFR  Result Value Ref Range   Glucose, Bld 88 65 - 99 mg/dL   BUN 16 7 - 25 mg/dL   Creat 0.94 0.70 - 1.30 mg/dL   eGFR 98 > OR = 60 mL/min/1.47m   BUN/Creatinine Ratio NOT APPLICABLE 6 - 22 (calc)   Sodium 140 135 - 146 mmol/L   Potassium 4.8 3.5 - 5.3 mmol/L   Chloride 108 98 - 110 mmol/L   CO2 23 20 - 32 mmol/L   Calcium 9.5 8.6 - 10.3 mg/dL   Total Protein 7.1 6.1 - 8.1 g/dL   Albumin 4.5 3.6 - 5.1 g/dL   Globulin 2.6 1.9 - 3.7 g/dL (calc)   AG Ratio 1.7 1.0 - 2.5 (calc)   Total Bilirubin 0.3 0.2 - 1.2 mg/dL   Alkaline phosphatase (APISO) 91 35 - 144 U/L   AST 14 10 - 35 U/L   ALT 13 9 - 46 U/L  Hemoglobin A1c  Result Value Ref Range   Hgb A1c MFr Bld 5.4 <5.7 % of total Hgb   Mean Plasma Glucose 108 mg/dL   eAG (mmol/L) 6.0 mmol/L      Assessment & Plan:   Problem List Items Addressed This Visit       Cardiovascular and Mediastinum   Essential hypertension - Primary    Continue taking lisinopril 20 mg daily add hydrochlorothiazide 12.5 mg daily.      Relevant Medications   hydrochlorothiazide (HYDRODIURIL) 12.5 MG tablet     Musculoskeletal and Integument   RESOLVED: Rash     Follow up plan: Return in about 4 weeks (around 04/02/2022) for follow up.

## 2022-04-01 ENCOUNTER — Other Ambulatory Visit: Payer: Self-pay | Admitting: Nurse Practitioner

## 2022-04-01 DIAGNOSIS — I10 Essential (primary) hypertension: Secondary | ICD-10-CM

## 2022-04-02 NOTE — Telephone Encounter (Signed)
Requested Prescriptions  Pending Prescriptions Disp Refills  . hydrochlorothiazide (HYDRODIURIL) 12.5 MG tablet [Pharmacy Med Name: HYDROCHLOROTHIAZIDE 12.5 MG TB] 30 tablet 0    Sig: TAKE 1 TABLET BY MOUTH EVERY DAY     Cardiovascular: Diuretics - Thiazide Failed - 04/01/2022  2:18 AM      Failed - Last BP in normal range    BP Readings from Last 1 Encounters:  03/05/22 (!) 160/84         Passed - Cr in normal range and within 180 days    Creat  Date Value Ref Range Status  01/30/2022 0.94 0.70 - 1.30 mg/dL Final         Passed - K in normal range and within 180 days    Potassium  Date Value Ref Range Status  01/30/2022 4.8 3.5 - 5.3 mmol/L Final  08/09/2014 3.6 3.5 - 5.1 mmol/L Final         Passed - Na in normal range and within 180 days    Sodium  Date Value Ref Range Status  01/30/2022 140 135 - 146 mmol/L Final  08/09/2014 139 136 - 145 mmol/L Final         Passed - Valid encounter within last 6 months    Recent Outpatient Visits          4 weeks ago Essential hypertension   [facility] Berniece Salines, FNP   2 months ago Essential hypertension   [facility] Berniece Salines, FNP   3 months ago Viral upper respiratory tract infection   [facility] Della Goo F, FNP   5 months ago Constipation, unspecified constipation type   [facility] Berniece Salines, FNP   9 months ago Essential hypertension   [facility] Berniece Salines, FNP      Future Appointments            In 2 months Zane Herald, Rudolpho Sevin, FNP [facility], [facility]

## 2022-04-14 IMAGING — CR DG ABDOMEN 1V
1 series · 2 of 2 positions shown · non-contrast
Comparison: None.

CLINICAL DATA: Constipation.

EXAM:
ABDOMEN - 1 VIEW

[Series 1: dg abd 1 view · 0.14mm/px · 2 of 2 slices shown]
[im 1/2]
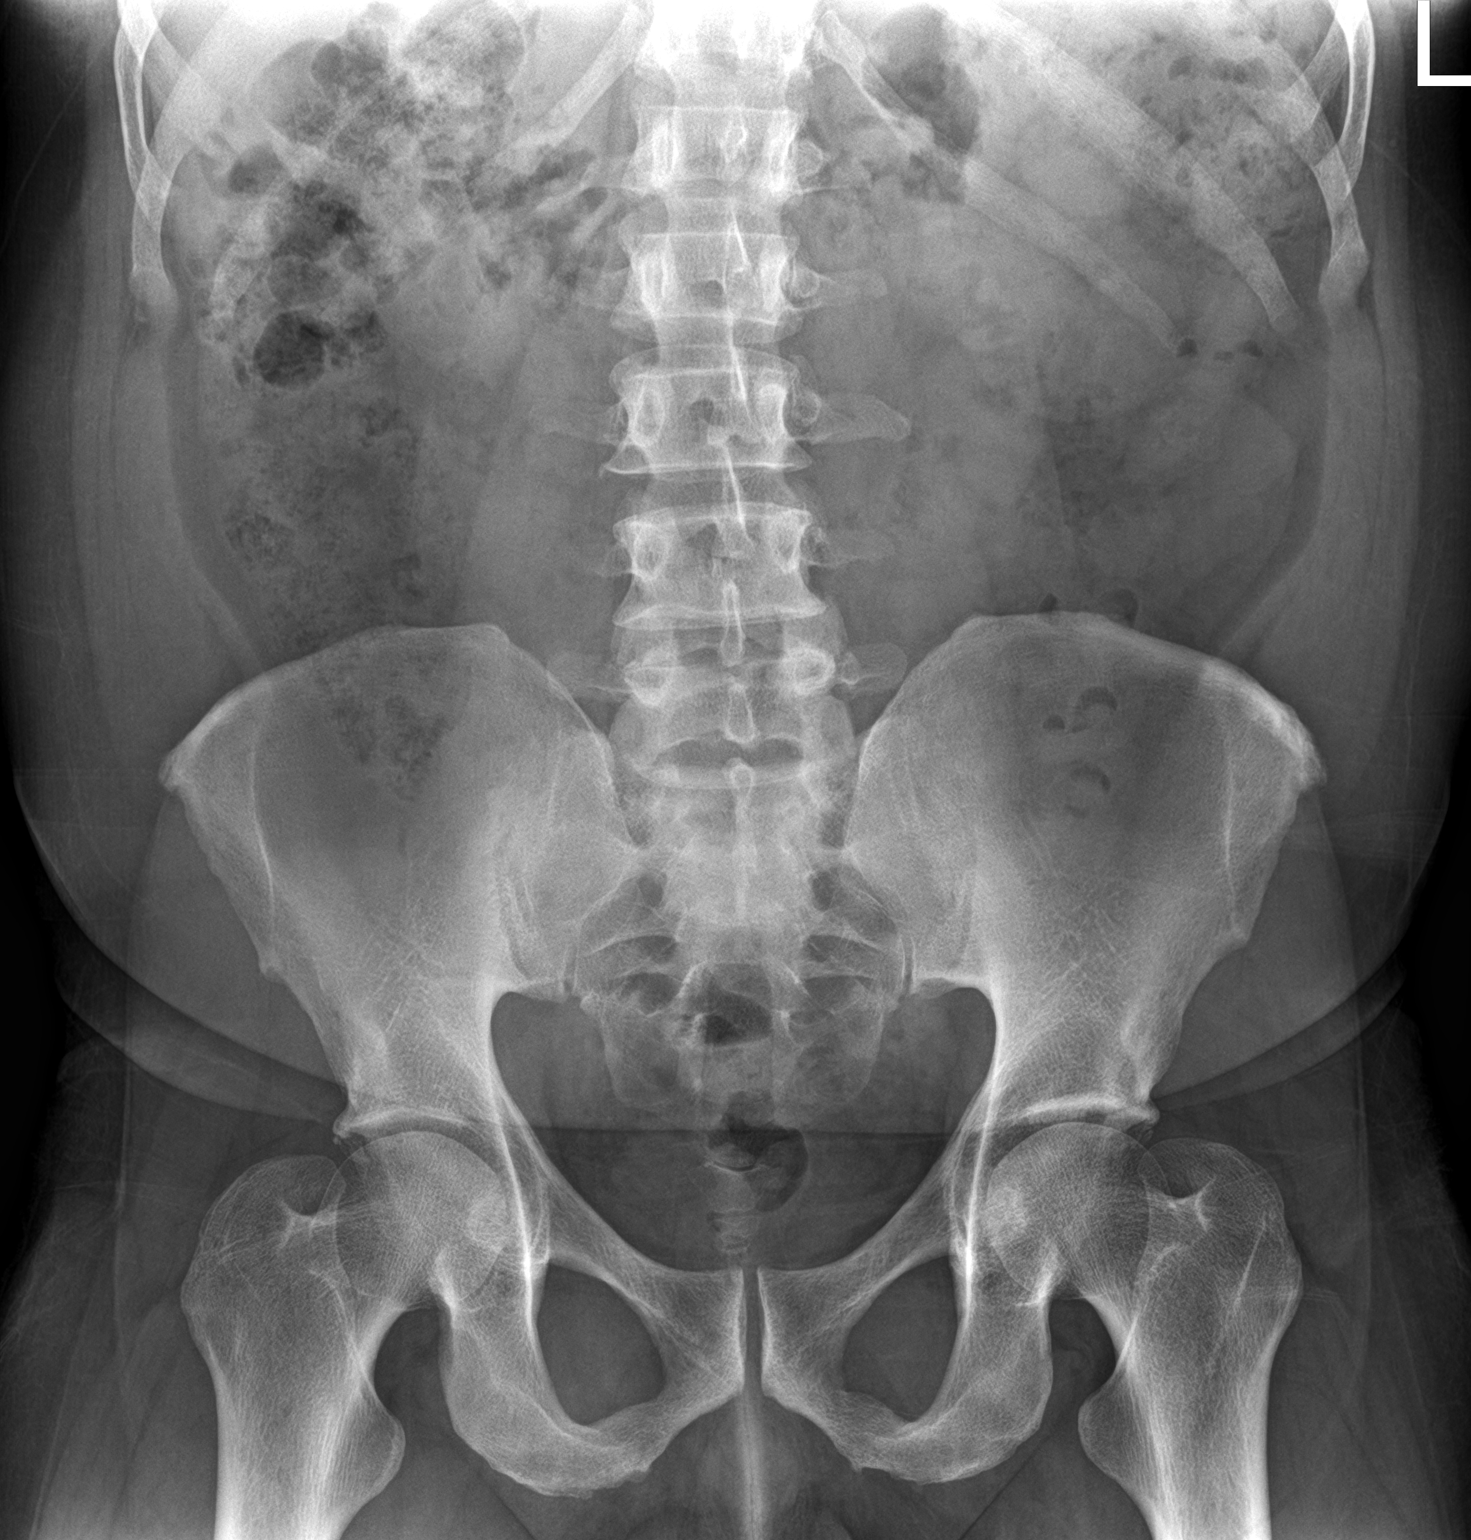
[im 2/2]
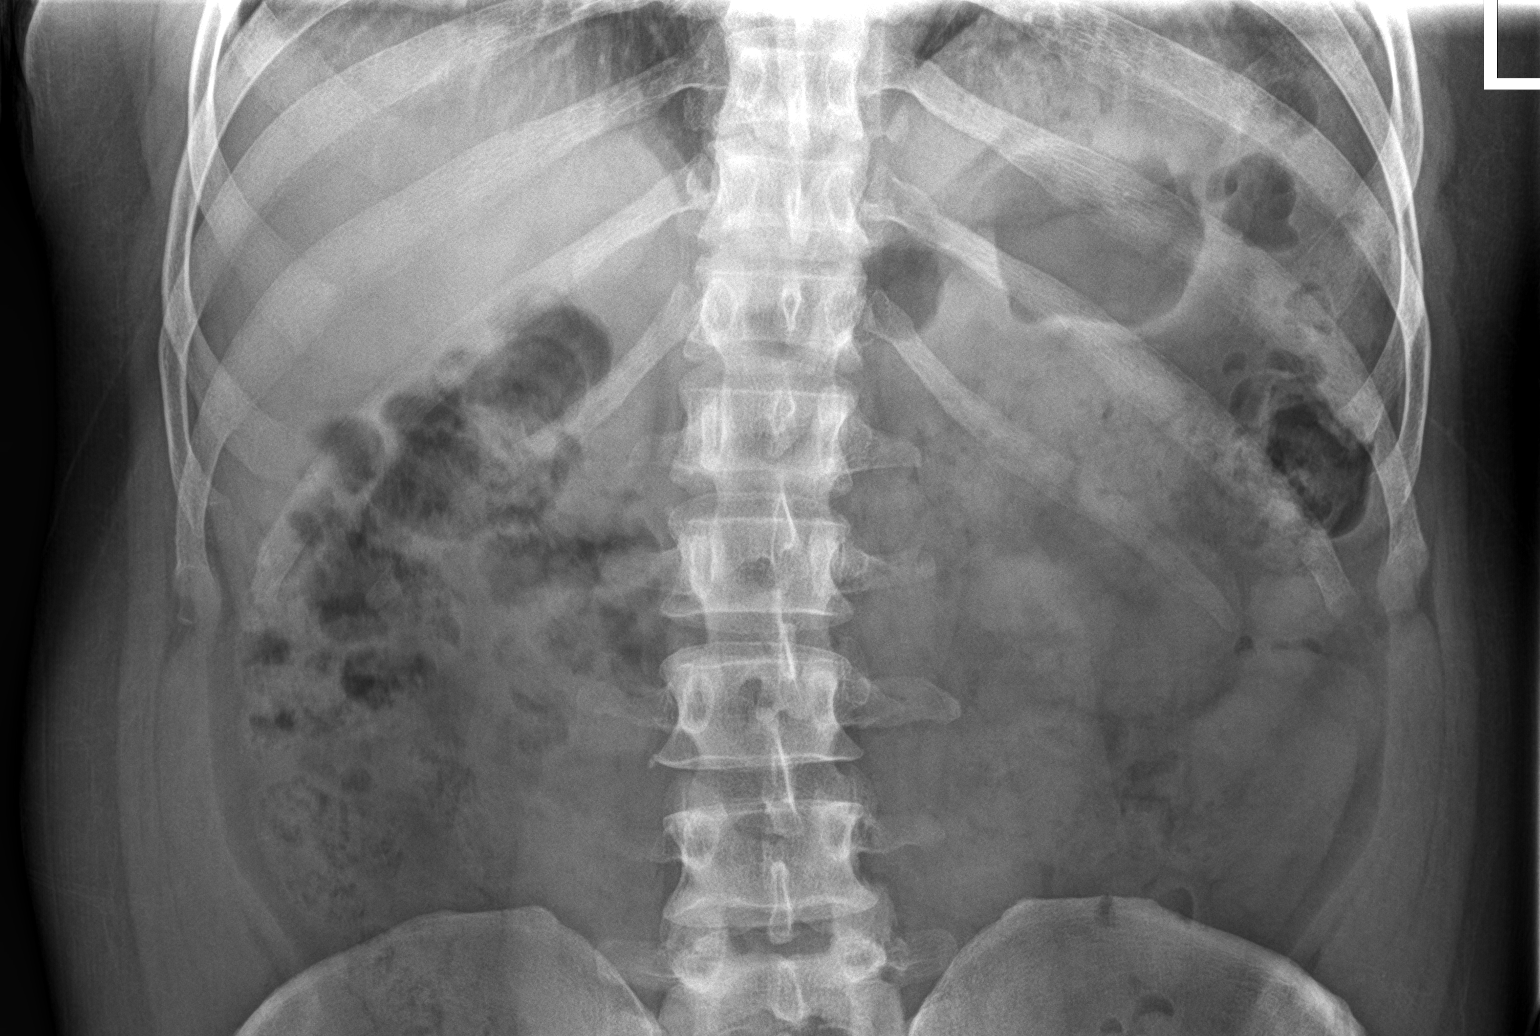

[2 of 2 positions shown; findings below may reference images not displayed]

FINDINGS: The bowel gas pattern is normal. A moderate amount of stool is seen
throughout the colon. No radio-opaque calculi or other significant
radiographic abnormality are seen.
IMPRESSION: Moderate severity stool burden without evidence of bowel
obstruction.

## 2022-06-02 ENCOUNTER — Ambulatory Visit (INDEPENDENT_AMBULATORY_CARE_PROVIDER_SITE_OTHER): Payer: BC Managed Care – PPO | Admitting: Nurse Practitioner

## 2022-06-02 ENCOUNTER — Other Ambulatory Visit: Payer: Self-pay

## 2022-06-02 ENCOUNTER — Encounter: Payer: Self-pay | Admitting: Nurse Practitioner

## 2022-06-02 VITALS — BP 138/82 | HR 64 | Temp 98.2°F | Resp 16 | Ht 66.0 in | Wt 180.4 lb

## 2022-06-02 DIAGNOSIS — I10 Essential (primary) hypertension: Secondary | ICD-10-CM | POA: Diagnosis not present

## 2022-06-02 DIAGNOSIS — E785 Hyperlipidemia, unspecified: Secondary | ICD-10-CM

## 2022-06-02 DIAGNOSIS — K5904 Chronic idiopathic constipation: Secondary | ICD-10-CM | POA: Diagnosis not present

## 2022-06-02 NOTE — Assessment & Plan Note (Signed)
ASCVD score 9.2%.  His last LDL was 84 on 01/30/2022.  Discussed decreasing saturated fats in his diet and getting his blood pressure under control.

## 2022-06-02 NOTE — Progress Notes (Signed)
BP 138/82   Pulse 64   Temp 98.2 F (36.8 C)   Resp 16   Ht 5' 6"  (1.676 m)   Wt 180 lb 6.4 oz (81.8 kg)   SpO2 97%   BMI 29.12 kg/m    Subjective:    Patient ID: Alan Mathews, male    DOB: [birth year], 51 y.o.   MRN: [id number]  HPI: Alan Mathews is a 51 y.o. male  Chief Complaint  Patient presents with   Hypertension   HTN: His blood pressure today is 152/94, retake is 138/82. Marland Kitchen He is currently taking lisinopril 20 mg daily and hydrochlorothiazide 12.5 mg daily. He denies any chest pain, shortness of breath, headaches or blurred vision.   Hyperlipidemia: His last LDL was 84 on 01/30/2022.  Not currently on any cholesterol-lowering medication. He is not wanting to add another medication at this time. Discussed decreasing saturated fats in his diet and getting his blood pressure under control.   The 10-year ASCVD risk score (Arnett DK, et al., 2019) is: 9.2%   Values used to calculate the score:     Age: 10 years     Sex: Male     Is Non-Hispanic African American: Yes     Diabetic: No     Tobacco smoker: No     Systolic Blood Pressure: 263 mmHg     Is BP treated: Yes     HDL Cholesterol: 66 mg/dL     Total Cholesterol: 165 mg/dL   Chronic constipation: Patient reports that he suffers from chronic constipation.  He is currently taking Linzess 290 mcg daily.  Patient reports this has been very helpful.  Relevant past medical, surgical, family and social history reviewed and updated as indicated. Interim medical history since our last visit reviewed. Allergies and medications reviewed and updated.  Review of Systems  Constitutional: Negative for fever or weight change.  Respiratory: Negative for cough and shortness of breath.   Cardiovascular: Negative for chest pain or palpitations.  Gastrointestinal: Negative for abdominal pain, no bowel changes.  Musculoskeletal: Negative for gait problem or joint swelling.  Skin: Negative for rash.  Neurological: Negative for  dizziness or headache.  No other specific complaints in a complete review of systems (except as listed in HPI above).      Objective:    BP 138/82   Pulse 64   Temp 98.2 F (36.8 C)   Resp 16   Ht 5' 6"  (1.676 m)   Wt 180 lb 6.4 oz (81.8 kg)   SpO2 97%   BMI 29.12 kg/m   Wt Readings from Last 3 Encounters:  06/02/22 180 lb 6.4 oz (81.8 kg)  03/05/22 179 lb 4.8 oz (81.3 kg)  01/30/22 176 lb 11.2 oz (80.2 kg)    Physical Exam  Constitutional: Patient appears well-developed and well-nourished. Overweight  No distress.  HEENT: head atraumatic, normocephalic, pupils equal and reactive to light, neck supple Cardiovascular: Normal rate, regular rhythm and normal heart sounds.  No murmur heard. No BLE edema. Pulmonary/Chest: Effort normal and breath sounds normal. No respiratory distress. Abdominal: Soft.  There is no tenderness. Psychiatric: Patient has a normal mood and affect. behavior is normal. Judgment and thought content normal.  Results for orders placed or performed in visit on 01/30/22  Lipid panel  Result Value Ref Range   Cholesterol 165 <200 mg/dL   HDL 66 > OR = 40 mg/dL   Triglycerides 70 <150 mg/dL   LDL Cholesterol (Calc) 84 mg/dL (  calc)   Total CHOL/HDL Ratio 2.5 <5.0 (calc)   Non-HDL Cholesterol (Calc) 99 <130 mg/dL (calc)  CBC with Differential/Platelet  Result Value Ref Range   WBC 6.3 3.8 - 10.8 Thousand/uL   RBC 5.52 4.20 - 5.80 Million/uL   Hemoglobin 15.6 13.2 - 17.1 g/dL   HCT 46.9 38.5 - 50.0 %   MCV 85.0 80.0 - 100.0 fL   MCH 28.3 27.0 - 33.0 pg   MCHC 33.3 32.0 - 36.0 g/dL   RDW 13.9 11.0 - 15.0 %   Platelets 267 140 - 400 Thousand/uL   MPV 11.3 7.5 - 12.5 fL   Neutro Abs 3,963 1,500 - 7,800 cells/uL   Lymphs Abs 1,556 850 - 3,900 cells/uL   Absolute Monocytes 605 200 - 950 cells/uL   Eosinophils Absolute 113 15 - 500 cells/uL   Basophils Absolute 63 0 - 200 cells/uL   Neutrophils Relative % 62.9 %   Total Lymphocyte 24.7 %   Monocytes  Relative 9.6 %   Eosinophils Relative 1.8 %   Basophils Relative 1.0 %  COMPLETE METABOLIC PANEL WITH GFR  Result Value Ref Range   Glucose, Bld 88 65 - 99 mg/dL   BUN 16 7 - 25 mg/dL   Creat 0.94 0.70 - 1.30 mg/dL   eGFR 98 > OR = 60 mL/min/1.65m   BUN/Creatinine Ratio NOT APPLICABLE 6 - 22 (calc)   Sodium 140 135 - 146 mmol/L   Potassium 4.8 3.5 - 5.3 mmol/L   Chloride 108 98 - 110 mmol/L   CO2 23 20 - 32 mmol/L   Calcium 9.5 8.6 - 10.3 mg/dL   Total Protein 7.1 6.1 - 8.1 g/dL   Albumin 4.5 3.6 - 5.1 g/dL   Globulin 2.6 1.9 - 3.7 g/dL (calc)   AG Ratio 1.7 1.0 - 2.5 (calc)   Total Bilirubin 0.3 0.2 - 1.2 mg/dL   Alkaline phosphatase (APISO) 91 35 - 144 U/L   AST 14 10 - 35 U/L   ALT 13 9 - 46 U/L  Hemoglobin A1c  Result Value Ref Range   Hgb A1c MFr Bld 5.4 <5.7 % of total Hgb   Mean Plasma Glucose 108 mg/dL   eAG (mmol/L) 6.0 mmol/L      Assessment & Plan:   Problem List Items Addressed This Visit       Cardiovascular and Mediastinum   Essential hypertension - Primary    Patient has an initial blood pressure was 152/94.  After letting patient sit blood pressure retake was 138/82.  We will continue with current treatment plan.  Patient currently taking lisinopril 20 mg daily and hydrochlorothiazide 12.5 mg daily.        Digestive   Chronic idiopathic constipation    Condition stable continue taking Linzess 290 mcg daily.        Other   Hyperlipidemia    ASCVD score 9.2%.  His last LDL was 84 on 01/30/2022.  Discussed decreasing saturated fats in his diet and getting his blood pressure under control.        Follow up plan: Return in about 4 months (around 10/02/2022) for follow up.

## 2022-06-02 NOTE — Assessment & Plan Note (Addendum)
Condition stable continue taking Linzess 290 mcg daily.

## 2022-06-02 NOTE — Assessment & Plan Note (Signed)
Patient has an initial blood pressure was 152/94.  After letting patient sit blood pressure retake was 138/82.  We will continue with current treatment plan.  Patient currently taking lisinopril 20 mg daily and hydrochlorothiazide 12.5 mg daily.

## 2022-08-09 ENCOUNTER — Other Ambulatory Visit: Payer: Self-pay | Admitting: Nurse Practitioner

## 2022-08-09 DIAGNOSIS — K5904 Chronic idiopathic constipation: Secondary | ICD-10-CM

## 2022-08-11 NOTE — Telephone Encounter (Signed)
Requested Prescriptions  Pending Prescriptions Disp Refills   LINZESS 290 MCG CAPS capsule [Pharmacy Med Name: LINZESS 290 MCG CAPSULE] 90 capsule 1    Sig: TAKE 1 CAPSULE BY MOUTH EVERY DAY BEFORE BREAKFAST     Gastroenterology: Irritable Bowel Syndrome Passed - 08/09/2022  9:40 AM      Passed - Valid encounter within last 12 months    Recent Outpatient Visits           2 months ago Essential hypertension   [facility] Berniece Salines, FNP   5 months ago Essential hypertension   [facility] Berniece Salines, FNP   6 months ago Essential hypertension   [facility] Berniece Salines, FNP   8 months ago Viral upper respiratory tract infection   [facility] Berniece Salines, FNP   9 months ago Constipation, unspecified constipation type   [facility] Berniece Salines, FNP       Future Appointments             In 1 month Zane Herald, Rudolpho Sevin, FNP [facility], [facility]

## 2022-10-02 ENCOUNTER — Encounter: Payer: Self-pay | Admitting: Nurse Practitioner

## 2022-10-02 ENCOUNTER — Ambulatory Visit (INDEPENDENT_AMBULATORY_CARE_PROVIDER_SITE_OTHER): Payer: BC Managed Care – PPO | Admitting: Nurse Practitioner

## 2022-10-02 ENCOUNTER — Other Ambulatory Visit: Payer: Self-pay

## 2022-10-02 VITALS — BP 136/74 | HR 68 | Temp 98.6°F | Resp 16 | Ht 66.0 in | Wt 165.7 lb

## 2022-10-02 DIAGNOSIS — K5904 Chronic idiopathic constipation: Secondary | ICD-10-CM | POA: Diagnosis not present

## 2022-10-02 DIAGNOSIS — I10 Essential (primary) hypertension: Secondary | ICD-10-CM

## 2022-10-02 DIAGNOSIS — E785 Hyperlipidemia, unspecified: Secondary | ICD-10-CM

## 2022-10-02 MED ORDER — LISINOPRIL 20 MG PO TABS: 30.0000 mg | ORAL_TABLET | Freq: Every day | ORAL | 3 refills | Status: DC

## 2022-10-02 MED ORDER — HYDROCHLOROTHIAZIDE 12.5 MG PO TABS: 12.5000 mg | ORAL_TABLET | Freq: Every day | ORAL | 1 refills | Status: DC

## 2022-10-02 NOTE — Assessment & Plan Note (Addendum)
Continue taking hydrochlorothiazide 12.5 mg daily and lisinopril 30 mg daily.

## 2022-10-02 NOTE — Assessment & Plan Note (Signed)
Need to work on lifestyle modification.  We will get labs today.

## 2022-10-02 NOTE — Assessment & Plan Note (Signed)
Continue taking Linzess 290 mcg daily, also eat a diet high in fiber

## 2022-10-02 NOTE — Progress Notes (Signed)
BP 136/74   Pulse 68   Temp 98.6 F (37 C) (Oral)   Resp 16   Ht 5\' 6"  (1.676 m)   Wt 165 lb 11.2 oz (75.2 kg)   SpO2 98%   BMI 26.74 kg/m    Subjective:    Patient ID: Alan Mathews, male    DOB: 1970/12/17, 52 y.o.   MRN: [id number]  HPI: Alan Mathews is a 52 y.o. male  Chief Complaint  Patient presents with   Hypertension   Hyperlipidemia   Constipation    4 month follow up   HTN: His blood pressure today is 136/74. He currently takes hydrochlorothiazide 12.5 mg daily and lisinopril 30 mg daily. He denies any shortness of breath, headaches, blurred vision or chest pain.   Hyperlipidemia:not currently on medications.  His last LDL was 84 on 01/30/2022. He has been working on lifestyle modification.   The 10-year ASCVD risk score (Arnett DK, et al., 2019) is: 9.4%   Values used to calculate the score:     Age: 11 years     Sex: Male     Is Non-Hispanic African American: Yes     Diabetic: No     Tobacco smoker: No     Systolic Blood Pressure: 136 mmHg     Is BP treated: Yes     HDL Cholesterol: 66 mg/dL     Total Cholesterol: 165 mg/dL   Chronic constipation: Patient continues to take Linzess 290 mcg daily.  Patient reports this has been helpful with his chronic constipation.  Continue with current treatment.       10/02/2022    3:23 PM 06/02/2022    2:30 PM 06/02/2022    2:27 PM 03/05/2022    2:55 PM 01/30/2022    1:06 PM  Depression screen PHQ 2/9  Decreased Interest 0 0 0 0 0  Down, Depressed, Hopeless 0 0 0 0 0  PHQ - 2 Score 0 0 0 0 0  Altered sleeping  0     Tired, decreased energy  0     Change in appetite  0     Feeling bad or failure about yourself   0     Trouble concentrating  0     Moving slowly or fidgety/restless  0     Suicidal thoughts  0     PHQ-9 Score  0     Difficult doing work/chores  Not difficult at all         Relevant past medical, surgical, family and social history reviewed and updated as indicated. Interim medical history since  our last visit reviewed. Allergies and medications reviewed and updated.  Review of Systems  Constitutional: Negative for fever or weight change.  Respiratory: Negative for cough and shortness of breath.   Cardiovascular: Negative for chest pain or palpitations.  Gastrointestinal: Negative for abdominal pain, no bowel changes.  Musculoskeletal: Negative for gait problem or joint swelling.  Skin: Negative for rash.  Neurological: Negative for dizziness or headache.  No other specific complaints in a complete review of systems (except as listed in HPI above).      Objective:    BP 136/74   Pulse 68   Temp 98.6 F (37 C) (Oral)   Resp 16   Ht 5\' 6"  (1.676 m)   Wt 165 lb 11.2 oz (75.2 kg)   SpO2 98%   BMI 26.74 kg/m   Wt Readings from Last 3 Encounters:  10/02/22 165 lb  11.2 oz (75.2 kg)  06/02/22 180 lb 6.4 oz (81.8 kg)  03/05/22 179 lb 4.8 oz (81.3 kg)    Physical Exam  Constitutional: Patient appears well-developed and well-nourished. Overweight  No distress.  HEENT: head atraumatic, normocephalic, pupils equal and reactive to light, neck supple Cardiovascular: Normal rate, regular rhythm and normal heart sounds.  No murmur heard. No BLE edema. Pulmonary/Chest: Effort normal and breath sounds normal. No respiratory distress. Abdominal: Soft.  There is no tenderness. Psychiatric: Patient has a normal mood and affect. behavior is normal. Judgment and thought content normal.  Results for orders placed or performed in visit on 01/30/22  Lipid panel  Result Value Ref Range   Cholesterol 165 <200 mg/dL   HDL 66 > OR = 40 mg/dL   Triglycerides 70 <150 mg/dL   LDL Cholesterol (Calc) 84 mg/dL (calc)   Total CHOL/HDL Ratio 2.5 <5.0 (calc)   Non-HDL Cholesterol (Calc) 99 <130 mg/dL (calc)  CBC with Differential/Platelet  Result Value Ref Range   WBC 6.3 3.8 - 10.8 Thousand/uL   RBC 5.52 4.20 - 5.80 Million/uL   Hemoglobin 15.6 13.2 - 17.1 g/dL   HCT 46.9 38.5 - 50.0 %   MCV  85.0 80.0 - 100.0 fL   MCH 28.3 27.0 - 33.0 pg   MCHC 33.3 32.0 - 36.0 g/dL   RDW 13.9 11.0 - 15.0 %   Platelets 267 140 - 400 Thousand/uL   MPV 11.3 7.5 - 12.5 fL   Neutro Abs 3,963 1,500 - 7,800 cells/uL   Lymphs Abs 1,556 850 - 3,900 cells/uL   Absolute Monocytes 605 200 - 950 cells/uL   Eosinophils Absolute 113 15 - 500 cells/uL   Basophils Absolute 63 0 - 200 cells/uL   Neutrophils Relative % 62.9 %   Total Lymphocyte 24.7 %   Monocytes Relative 9.6 %   Eosinophils Relative 1.8 %   Basophils Relative 1.0 %  COMPLETE METABOLIC PANEL WITH GFR  Result Value Ref Range   Glucose, Bld 88 65 - 99 mg/dL   BUN 16 7 - 25 mg/dL   Creat 0.94 0.70 - 1.30 mg/dL   eGFR 98 > OR = 60 mL/min/1.18m2   BUN/Creatinine Ratio NOT APPLICABLE 6 - 22 (calc)   Sodium 140 135 - 146 mmol/L   Potassium 4.8 3.5 - 5.3 mmol/L   Chloride 108 98 - 110 mmol/L   CO2 23 20 - 32 mmol/L   Calcium 9.5 8.6 - 10.3 mg/dL   Total Protein 7.1 6.1 - 8.1 g/dL   Albumin 4.5 3.6 - 5.1 g/dL   Globulin 2.6 1.9 - 3.7 g/dL (calc)   AG Ratio 1.7 1.0 - 2.5 (calc)   Total Bilirubin 0.3 0.2 - 1.2 mg/dL   Alkaline phosphatase (APISO) 91 35 - 144 U/L   AST 14 10 - 35 U/L   ALT 13 9 - 46 U/L  Hemoglobin A1c  Result Value Ref Range   Hgb A1c MFr Bld 5.4 <5.7 % of total Hgb   Mean Plasma Glucose 108 mg/dL   eAG (mmol/L) 6.0 mmol/L      Assessment & Plan:   Problem List Items Addressed This Visit       Cardiovascular and Mediastinum   Essential hypertension - Primary    Continue taking hydrochlorothiazide 12.5 mg daily and lisinopril 30 mg daily.      Relevant Medications   lisinopril (ZESTRIL) 20 MG tablet   hydrochlorothiazide (HYDRODIURIL) 12.5 MG tablet   Other Relevant Orders  CBC with Differential/Platelet   COMPLETE METABOLIC PANEL WITH GFR     Digestive   Chronic idiopathic constipation    Continue taking Linzess 290 mcg daily, also eat a diet high in fiber        Other   Hyperlipidemia    Need to  work on lifestyle modification.  We will get labs today.      Relevant Medications   lisinopril (ZESTRIL) 20 MG tablet   hydrochlorothiazide (HYDRODIURIL) 12.5 MG tablet   Other Relevant Orders   Lipid panel     Follow up plan: Return in about 6 months (around 04/02/2023) for follow up.

## 2022-10-03 LAB — CBC WITH DIFFERENTIAL/PLATELET
Absolute Monocytes: 437 cells/uL (ref 200–950)
Basophils Absolute: 38 cells/uL (ref 0–200)
Basophils Relative: 0.7 %
Eosinophils Absolute: 211 cells/uL (ref 15–500)
Eosinophils Relative: 3.9 %
HCT: 44.1 % (ref 38.5–50.0)
Hemoglobin: 14.8 g/dL (ref 13.2–17.1)
Lymphs Abs: 1334 cells/uL (ref 850–3900)
MCH: 28.1 pg (ref 27.0–33.0)
MCHC: 33.6 g/dL (ref 32.0–36.0)
MCV: 83.8 fL (ref 80.0–100.0)
MPV: 11.5 fL (ref 7.5–12.5)
Monocytes Relative: 8.1 %
Neutro Abs: 3380 cells/uL (ref 1500–7800)
Neutrophils Relative %: 62.6 %
Platelets: 278 10*3/uL (ref 140–400)
RBC: 5.26 10*6/uL (ref 4.20–5.80)
RDW: 13.4 % (ref 11.0–15.0)
Total Lymphocyte: 24.7 %
WBC: 5.4 10*3/uL (ref 3.8–10.8)

## 2022-10-03 LAB — LIPID PANEL
Cholesterol: 152 mg/dL (ref <200)
HDL: 56 mg/dL (ref >=40)
LDL Cholesterol (Calc): 82 mg/dL (calc)
Non-HDL Cholesterol (Calc): 96 mg/dL (calc) (ref <130)
Total CHOL/HDL Ratio: 2.7 (calc) (ref <5.0)
Triglycerides: 67 mg/dL (ref <150)

## 2022-10-03 LAB — COMPLETE METABOLIC PANEL WITH GFR
AG Ratio: 1.7 (calc) (ref 1.0–2.5)
ALT: 11 U/L (ref 9–46)
AST: 13 U/L (ref 10–35)
Albumin: 4.1 g/dL (ref 3.6–5.1)
Alkaline phosphatase (APISO): 80 U/L (ref 35–144)
BUN: 19 mg/dL (ref 7–25)
CO2: 26 mmol/L (ref 20–32)
Calcium: 9.1 mg/dL (ref 8.6–10.3)
Chloride: 109 mmol/L (ref 98–110)
Creat: 1.05 mg/dL (ref 0.70–1.30)
Globulin: 2.4 g/dL (calc) (ref 1.9–3.7)
Glucose, Bld: 82 mg/dL (ref 65–99)
Potassium: 4.4 mmol/L (ref 3.5–5.3)
Sodium: 142 mmol/L (ref 135–146)
Total Bilirubin: 0.3 mg/dL (ref 0.2–1.2)
Total Protein: 6.5 g/dL (ref 6.1–8.1)
eGFR: 85 mL/min/{1.73_m2} (ref >=60)

## 2022-11-14 ENCOUNTER — Other Ambulatory Visit: Payer: Self-pay

## 2022-11-14 ENCOUNTER — Ambulatory Visit (INDEPENDENT_AMBULATORY_CARE_PROVIDER_SITE_OTHER): Payer: BC Managed Care – PPO | Admitting: Nurse Practitioner

## 2022-11-14 ENCOUNTER — Encounter: Payer: Self-pay | Admitting: Nurse Practitioner

## 2022-11-14 VITALS — BP 130/84 | HR 66 | Temp 98.4°F | Resp 18 | Ht 66.0 in | Wt 175.5 lb

## 2022-11-14 DIAGNOSIS — B356 Tinea cruris: Secondary | ICD-10-CM

## 2022-11-14 MED ORDER — NYSTATIN 100000 UNIT/GM EX POWD: 1.0000 | Freq: Three times a day (TID) | CUTANEOUS | 1 refills | Status: DC

## 2022-11-14 NOTE — Progress Notes (Signed)
BP 130/84   Pulse 66   Temp 98.4 F (36.9 C) (Oral)   Resp 18   Ht '5\' 6"'$  (1.676 m)   Wt 175 lb 8 oz (79.6 kg)   SpO2 99%   BMI 28.33 kg/m    Subjective:    Patient ID: Alan Mathews, male    DOB: 06-Nov-1970, 52 y.o.   MRN: [id number]  HPI: Alan Mathews is a 52 y.o. male  Chief Complaint  Patient presents with   Recurrent Skin Infections    Jock itch   Jock itch:  patient reports for the last week he has been broke out with rash. He says that he works for [employer] and he is always sweaty.  He says he tried baby powder and lotrimin cream.  He says it helped but he is still having itching. Will send in nystatin powder. Discussed keeping area clean and dry.  Relevant past medical, surgical, family and social history reviewed and updated as indicated. Interim medical history since our last visit reviewed. Allergies and medications reviewed and updated.  Review of Systems  Constitutional: Negative for fever or weight change.  Respiratory: Negative for cough and shortness of breath.   Cardiovascular: Negative for chest pain or palpitations.  Gastrointestinal: Negative for abdominal pain, no bowel changes.  Musculoskeletal: Negative for gait problem or joint swelling.  Skin: positive for rash.  Neurological: Negative for dizziness or headache.  No other specific complaints in a complete review of systems (except as listed in HPI above).      Objective:    BP 130/84   Pulse 66   Temp 98.4 F (36.9 C) (Oral)   Resp 18   Ht '5\' 6"'$  (1.676 m)   Wt 175 lb 8 oz (79.6 kg)   SpO2 99%   BMI 28.33 kg/m   Wt Readings from Last 3 Encounters:  11/14/22 175 lb 8 oz (79.6 kg)  10/02/22 165 lb 11.2 oz (75.2 kg)  06/02/22 180 lb 6.4 oz (81.8 kg)    Physical Exam  Constitutional: Patient appears well-developed and well-nourished.  No distress.  HEENT: head atraumatic, normocephalic, pupils equal and reactive to light, neck supple Cardiovascular: Normal rate, regular rhythm and  normal heart sounds.  No murmur heard. No BLE edema. Pulmonary/Chest: Effort normal and breath sounds normal. No respiratory distress. Abdominal: Soft.  There is no tenderness. Groin: tinea cruris Psychiatric: Patient has a normal mood and affect. behavior is normal. Judgment and thought content normal.   Results for orders placed or performed in visit on 10/02/22  CBC with Differential/Platelet  Result Value Ref Range   WBC 5.4 3.8 - 10.8 Thousand/uL   RBC 5.26 4.20 - 5.80 Million/uL   Hemoglobin 14.8 13.2 - 17.1 g/dL   HCT 44.1 38.5 - 50.0 %   MCV 83.8 80.0 - 100.0 fL   MCH 28.1 27.0 - 33.0 pg   MCHC 33.6 32.0 - 36.0 g/dL   RDW 13.4 11.0 - 15.0 %   Platelets 278 140 - 400 Thousand/uL   MPV 11.5 7.5 - 12.5 fL   Neutro Abs 3,380 1,500 - 7,800 cells/uL   Lymphs Abs 1,334 850 - 3,900 cells/uL   Absolute Monocytes 437 200 - 950 cells/uL   Eosinophils Absolute 211 15 - 500 cells/uL   Basophils Absolute 38 0 - 200 cells/uL   Neutrophils Relative % 62.6 %   Total Lymphocyte 24.7 %   Monocytes Relative 8.1 %   Eosinophils Relative 3.9 %  Basophils Relative 0.7 %  COMPLETE METABOLIC PANEL WITH GFR  Result Value Ref Range   Glucose, Bld 82 65 - 99 mg/dL   BUN 19 7 - 25 mg/dL   Creat 1.05 0.70 - 1.30 mg/dL   eGFR 85 > OR = 60 mL/min/1.29m   BUN/Creatinine Ratio SEE NOTE: 6 - 22 (calc)   Sodium 142 135 - 146 mmol/L   Potassium 4.4 3.5 - 5.3 mmol/L   Chloride 109 98 - 110 mmol/L   CO2 26 20 - 32 mmol/L   Calcium 9.1 8.6 - 10.3 mg/dL   Total Protein 6.5 6.1 - 8.1 g/dL   Albumin 4.1 3.6 - 5.1 g/dL   Globulin 2.4 1.9 - 3.7 g/dL (calc)   AG Ratio 1.7 1.0 - 2.5 (calc)   Total Bilirubin 0.3 0.2 - 1.2 mg/dL   Alkaline phosphatase (APISO) 80 35 - 144 U/L   AST 13 10 - 35 U/L   ALT 11 9 - 46 U/L  Lipid panel  Result Value Ref Range   Cholesterol 152 <200 mg/dL   HDL 56 > OR = 40 mg/dL   Triglycerides 67 <150 mg/dL   LDL Cholesterol (Calc) 82 mg/dL (calc)   Total CHOL/HDL Ratio 2.7  <5.0 (calc)   Non-HDL Cholesterol (Calc) 96 <130 mg/dL (calc)      Assessment & Plan:   Problem List Items Addressed This Visit   None Visit Diagnoses     Tinea cruris    -  Primary   keep area clean and dry, use nystatin powder three times a day   Relevant Medications   nystatin (MYCOSTATIN/NYSTOP) powder        Follow up plan: Return if symptoms worsen or fail to improve.

## 2022-12-11 DIAGNOSIS — L910 Hypertrophic scar: Secondary | ICD-10-CM | POA: Diagnosis not present

## 2023-01-21 ENCOUNTER — Telehealth: Payer: Self-pay | Admitting: Nurse Practitioner

## 2023-01-21 NOTE — Telephone Encounter (Signed)
Copied from CRM [id number]. Topic: General - Other >> Jan 21, 2023 12:30 PM Ja-Kwan M wrote: Reason for CRM: Pt stated that a prior authorization is needed for linaclotide (LINZESS) 290 MCG CAPS capsule

## 2023-01-23 NOTE — Telephone Encounter (Signed)
Order has been sent to plan waiting on reply

## 2023-01-23 NOTE — Telephone Encounter (Signed)
Patient called to get an update on the prior authorization needed for linaclotide (LINZESS) 290 MCG CAPS capsule. Please advise.

## 2023-04-02 ENCOUNTER — Ambulatory Visit: Payer: BC Managed Care – PPO | Admitting: Nurse Practitioner

## 2023-04-03 ENCOUNTER — Encounter: Payer: Self-pay | Admitting: Family Medicine

## 2023-04-03 ENCOUNTER — Ambulatory Visit: Payer: BC Managed Care – PPO | Admitting: Family Medicine

## 2023-04-03 VITALS — BP 136/80 | HR 75 | Temp 98.4°F | Resp 18 | Ht 66.0 in | Wt 170.8 lb

## 2023-04-03 DIAGNOSIS — E785 Hyperlipidemia, unspecified: Secondary | ICD-10-CM | POA: Diagnosis not present

## 2023-04-03 DIAGNOSIS — Z23 Encounter for immunization: Secondary | ICD-10-CM

## 2023-04-03 DIAGNOSIS — K5904 Chronic idiopathic constipation: Secondary | ICD-10-CM

## 2023-04-03 DIAGNOSIS — Z1211 Encounter for screening for malignant neoplasm of colon: Secondary | ICD-10-CM

## 2023-04-03 DIAGNOSIS — I10 Essential (primary) hypertension: Secondary | ICD-10-CM | POA: Diagnosis not present

## 2023-04-03 MED ORDER — LISINOPRIL-HYDROCHLOROTHIAZIDE 20-12.5 MG PO TABS: 1.0000 | ORAL_TABLET | Freq: Every day | ORAL | 3 refills | Status: DC

## 2023-04-03 MED ORDER — LINACLOTIDE 290 MCG PO CAPS: 290.0000 ug | ORAL_CAPSULE | Freq: Every day | ORAL | 1 refills | Status: DC

## 2023-04-03 NOTE — Assessment & Plan Note (Signed)
Sx well controlled with linzess, refills sent in

## 2023-04-03 NOTE — Patient Instructions (Signed)
[facility] - Dr. Tobi Bastos  [phone]   Give them a call to arrange the colonoscopy again

## 2023-04-03 NOTE — Assessment & Plan Note (Signed)
BP above goal, not well controlled, poor med compliance, he takes irregularly He is willing to try combo pill daily when waking up for work  BP Readings from Last 3 Encounters:  04/03/23 136/80  11/14/22 130/84  10/02/22 136/74

## 2023-04-03 NOTE — Assessment & Plan Note (Signed)
Not on meds, last labs reviewed

## 2023-04-03 NOTE — Progress Notes (Signed)
Name: Alan Mathews   MRN: [id number]    DOB: [birth year]   Date:04/03/2023       Progress Note  Chief Complaint  Patient presents with   Follow-up   Hypertension   Hyperlipidemia     Subjective:   Alan Mathews is a 52 y.o. male, presents to clinic for routine f/up  Hypertension:  Currently managed on lisinopril 30 mg and hydrochlorothiazide 12.5 not taking regularly Bp above goal BP Readings from Last 3 Encounters:  04/03/23 136/80  11/14/22 130/84  10/02/22 136/74   Pt denies CP, SOB, exertional sx, LE edema, palpitation, Ha's, visual disturbances, lightheadedness, hypotension, syncope. Dietary efforts for BP?  none   Constipation on linzess works well for him - rx by [employer]  Needs to f/up with GI colonoscopy   HLD labs reviewed, pt not on meds currently      Current Outpatient Medications:    hydrochlorothiazide (HYDRODIURIL) 12.5 MG tablet, Take 1 tablet (12.5 mg total) by mouth daily., Disp: 90 tablet, Rfl: 1   linaclotide (LINZESS) 290 MCG CAPS capsule, Take 290 mcg by mouth daily before breakfast., Disp: , Rfl:    lisinopril (ZESTRIL) 20 MG tablet, Take 1.5 tablets (30 mg total) by mouth daily., Disp: 90 tablet, Rfl: 3  Patient Active Problem List   Diagnosis Date Noted   Chronic idiopathic constipation 01/30/2022   Overweight 07/26/2020   Hyperlipidemia 07/26/2020   Abnormal ECG 05/28/2018   Non-adherence to medical treatment 05/28/2018   Colon cancer screening 11/12/2017   Blood in the stool 11/12/2017   Preventative health care 11/12/2017   Vitamin D deficiency 06/09/2017   GERD (gastroesophageal reflux disease) 06/30/2016   Essential hypertension 04/03/2015   Allergic rhinitis 02/19/2015   Anxiety 02/19/2015   Lactose intolerance 02/19/2015   Obstructive apnea 02/19/2015    Past Surgical History:  Procedure Laterality Date   COLONOSCOPY WITH PROPOFOL N/A 11/29/2021   Procedure: COLONOSCOPY WITH PROPOFOL;  Surgeon: Wyline Mood, MD;  Location:  [facility] ENDOSCOPY;  Service: Gastroenterology;  Laterality: N/A;   TONSILLECTOMY AND ADENOIDECTOMY  04/03/2010    Family History  Problem Relation Age of Onset   Hypertension Mother    Hypertension Father    Hypertension Brother    Aneurysm Maternal Grandmother     Social History   Tobacco Use   Smoking status: Never   Smokeless tobacco: Never  Vaping Use   Vaping status: Never Used  Substance Use Topics   Alcohol use: No   Drug use: No     No Known Allergies  Health Maintenance  Topic Date Due   COVID-19 Vaccine (3 - Pfizer risk series) 04/19/2023 (Originally 02/28/2020)   Zoster Vaccines- Shingrix (1 of 2) 07/04/2023 (Originally 09/20/1989)   INFLUENZA VACCINE  04/09/2023   Colonoscopy  11/30/2031   DTaP/Tdap/Td (4 - Td or Tdap) 04/02/2033   Hepatitis C Screening  Completed   HIV Screening  Completed   HPV VACCINES  Aged Out    Chart Review Today: I personally reviewed active problem list, medication list, allergies, family history, social history, health maintenance, notes from last encounter, lab results, imaging with the patient/caregiver today.   Review of Systems  Constitutional: Negative.   HENT: Negative.    Eyes: Negative.   Respiratory: Negative.    Cardiovascular: Negative.   Gastrointestinal: Negative.   Endocrine: Negative.   Genitourinary: Negative.   Musculoskeletal: Negative.   Skin: Negative.   Allergic/Immunologic: Negative.   Neurological: Negative.   Hematological: Negative.  Psychiatric/Behavioral: Negative.    All other systems reviewed and are negative.    Objective:   Vitals:   04/03/23 0927  BP: 136/80  Pulse: 75  Resp: 18  Temp: 98.4 F (36.9 C)  SpO2: 97%  Weight: 170 lb 12.8 oz (77.5 kg)  Height: 5\' 6"  (1.676 m)    Body mass index is 27.57 kg/m.  Physical Exam Vitals and nursing note reviewed.  Constitutional:      General: He is not in acute distress.    Appearance: Normal appearance. He is well-developed. He  is not ill-appearing, toxic-appearing or diaphoretic.  HENT:     Head: Normocephalic and atraumatic.     Nose: Nose normal.  Eyes:     General:        Right eye: No discharge.        Left eye: No discharge.     Conjunctiva/sclera: Conjunctivae normal.  Neck:     Trachea: No tracheal deviation.  Cardiovascular:     Rate and Rhythm: Normal rate and regular rhythm.  Pulmonary:     Effort: Pulmonary effort is normal. No respiratory distress.     Breath sounds: No stridor.  Musculoskeletal:        General: Normal range of motion.  Skin:    General: Skin is warm and dry.     Findings: No rash.  Neurological:     Mental Status: He is alert.     Motor: No abnormal muscle tone.     Coordination: Coordination normal.  Psychiatric:        Behavior: Behavior normal.         Assessment & Plan:   Problem List Items Addressed This Visit       Cardiovascular and Mediastinum   Essential hypertension - Primary    BP above goal, not well controlled, poor med compliance, he takes irregularly He is willing to try combo pill daily when waking up for work  BP Readings from Last 3 Encounters:  04/03/23 136/80  11/14/22 130/84  10/02/22 136/74         Relevant Medications   lisinopril-hydrochlorothiazide (ZESTORETIC) 20-12.5 MG tablet     Digestive   Chronic idiopathic constipation    Sx well controlled with linzess, refills sent in      Relevant Medications   linaclotide (LINZESS) 290 MCG CAPS capsule     Other   Hyperlipidemia    Not on meds, last labs reviewed      Relevant Medications   lisinopril-hydrochlorothiazide (ZESTORETIC) 20-12.5 MG tablet   Other Visit Diagnoses     Need for Tdap vaccination       Relevant Orders   Tdap vaccine greater than or equal to 7yo IM (Completed)   Screening for malignant neoplasm of colon       Relevant Orders   Ambulatory referral to Gastroenterology        Return in about 6 months (around 10/04/2023) for Annual Physical.    Danelle Berry, PA-C 04/03/23 9:48 AM

## 2023-04-10 ENCOUNTER — Encounter: Payer: Self-pay | Admitting: *Deleted

## 2023-04-24 ENCOUNTER — Encounter: Payer: Self-pay | Admitting: Physician Assistant

## 2023-04-24 ENCOUNTER — Telehealth: Payer: BC Managed Care – PPO | Admitting: Physician Assistant

## 2023-04-24 ENCOUNTER — Ambulatory Visit: Payer: Self-pay

## 2023-04-24 DIAGNOSIS — Z20822 Contact with and (suspected) exposure to covid-19: Secondary | ICD-10-CM

## 2023-04-24 MED ORDER — BENZONATATE 100 MG PO CAPS
100.0000 mg | ORAL_CAPSULE | Freq: Three times a day (TID) | ORAL | 0 refills | Status: DC | PRN
Start: 2023-04-24 — End: 2023-08-05

## 2023-04-24 NOTE — Patient Instructions (Addendum)
  Alan Mathews, thank you for joining Piedad Climes, PA-C for today's virtual visit.  While this provider is not your primary care provider (PCP), if your PCP is located in our provider database this encounter information will be shared with them immediately following your visit.   A [facility] MyChart account gives you access to today's visit and all your visits, tests, and labs performed at [facility] " click here if you don't have a [facility] MyChart account or go to [email or web address]  Consent: (Patient) Alan Mathews provided verbal consent for this virtual visit at the beginning of the encounter.  Current Medications:  Current Outpatient Medications:    linaclotide (LINZESS) 290 MCG CAPS capsule, Take 290 mcg by mouth daily before breakfast., Disp: , Rfl:    linaclotide (LINZESS) 290 MCG CAPS capsule, Take 1 capsule (290 mcg total) by mouth daily before breakfast., Disp: 90 capsule, Rfl: 1   lisinopril-hydrochlorothiazide (ZESTORETIC) 20-12.5 MG tablet, Take 1 tablet by mouth daily., Disp: 90 tablet, Rfl: 3   Medications ordered in this encounter:  No orders of the defined types were placed in this encounter.    *If you need refills on other medications prior to your next appointment, please contact your pharmacy*  Follow-Up: Call back or seek an in-person evaluation if the symptoms worsen or if the condition fails to improve as anticipated.  [facility] [phone]  Other Instructions Please message back ASAP with COVID test results so we can determine need for antiviral medication and isolation. Please keep well-hydrated and get plenty of rest. Start a saline nasal rinse to flush out your nasal passages. You can use plain Mucinex to help thin congestion. Use the Tessalon as directed for cough. If you have a humidifier, running in the bedroom at night. I want you to start OTC vitamin D3 1000 units daily, vitamin C 1000 mg daily, and  a zinc supplement. Please take prescribed medications as directed.      If you have been instructed to have an in-person evaluation today at a local Urgent Care facility, please use the link below. It will take you to a list of all of our available [facility], including address, phone number and hours of operation. Please do not delay care.  [facility]  If you or a family member do not have a primary care provider, use the link below to schedule a visit and establish care. When you choose a [facility] primary care physician or advanced practice provider, you gain a long-term partner in health. Find a Primary Care Provider  Learn more about [facility]'s in-office and virtual care options: [facility] - Get Care Now

## 2023-04-24 NOTE — Progress Notes (Signed)
Virtual Visit Consent   Alan Mathews, you are scheduled for a virtual visit with a [facility] provider today. Just as with appointments in the office, your consent must be obtained to participate. Your consent will be active for this visit and any virtual visit you may have with one of our providers in the next 365 days. If you have a MyChart account, a copy of this consent can be sent to you electronically.  As this is a virtual visit, video technology does not allow for your provider to perform a traditional examination. This may limit your provider's ability to fully assess your condition. If your provider identifies any concerns that need to be evaluated in person or the need to arrange testing (such as labs, EKG, etc.), we will make arrangements to do so. Although advances in technology are sophisticated, we cannot ensure that it will always work on either your end or our end. If the connection with a video visit is poor, the visit may have to be switched to a telephone visit. With either a video or telephone visit, we are not always able to ensure that we have a secure connection.  By engaging in this virtual visit, you consent to the provision of healthcare and authorize for your insurance to be billed (if applicable) for the services provided during this visit. Depending on your insurance coverage, you may receive a charge related to this service.  I need to obtain your verbal consent now. Are you willing to proceed with your visit today? Alan Mathews has provided verbal consent on 04/24/2023 for a virtual visit (video or telephone). Piedad Climes, [place]  Date: 04/24/2023 5:51 PM  Virtual Visit via Video Note   I, Piedad Climes, connected with  Alan Mathews  ([id number], 1971/02/22) on 04/24/23 at 11:15 AM EDT by a video-enabled telemedicine application and verified that I am speaking with the correct person using two identifiers.  Location: Patient: Virtual Visit Location  Patient: Home Provider: Virtual Visit Location Provider: Home Office   I discussed the limitations of evaluation and management by telemedicine and the availability of in person appointments. The patient expressed understanding and agreed to proceed.    History of Present Illness: Alan Mathews is a 52 y.o. who identifies as a male who was assigned male at birth, and is being seen today for an acute illness. Notes that symptoms started Wednesday night while working with cough, chills, malaise worsening into Thursday morning with some fatigue/weakness.Was working with a fan on him so thought was related.  Thursday into Thursday night noting body aches with continued chills and cough, also associated with an intermittent headache. Is able to hydrate and rest. Early this AM started with increased chest congestion and cough. Denies chest pain or SOB. Some   Has not tested for COVID. Vicks Vapor Rub started this AM.    HPI: HPI  Problems:  Patient Active Problem List   Diagnosis Date Noted   Chronic idiopathic constipation 01/30/2022   Overweight 07/26/2020   Hyperlipidemia 07/26/2020   Abnormal ECG 05/28/2018   Non-adherence to medical treatment 05/28/2018   Colon cancer screening 11/12/2017   Blood in the stool 11/12/2017   Preventative health care 11/12/2017   Vitamin D deficiency 06/09/2017   GERD (gastroesophageal reflux disease) 06/30/2016   Essential hypertension 04/03/2015   Allergic rhinitis 02/19/2015   Anxiety 02/19/2015   Lactose intolerance 02/19/2015   Obstructive apnea 02/19/2015    Allergies: No Known Allergies Medications:  Current Outpatient Medications:    benzonatate (TESSALON) 100 MG capsule, Take 1 capsule (100 mg total) by mouth 3 (three) times daily as needed for cough., Disp: 30 capsule, Rfl: 0   linaclotide (LINZESS) 290 MCG CAPS capsule, Take 1 capsule (290 mcg total) by mouth daily before breakfast., Disp: 90 capsule, Rfl: 1    lisinopril-hydrochlorothiazide (ZESTORETIC) 20-12.5 MG tablet, Take 1 tablet by mouth daily., Disp: 90 tablet, Rfl: 3  Observations/Objective: Patient is well-developed, well-nourished in no acute distress.  Resting comfortably at home.  Head is normocephalic, atraumatic.  No labored breathing. Speech is clear and coherent with logical content.  Patient is alert and oriented at baseline.   Assessment and Plan: 1. Suspected COVID-19 virus infection  Giving current symptoms want him tested for COVID due to rising rates in the area. He notes having a home test he will take. Message sent via MyChart so he can message back directly with results. If positive, will discuss antivirals and isolation. Regardless, want him to hydrate and rest. OTC medications reviewed. Tessalon per orders for cough.   Follow Up Instructions: I discussed the assessment and treatment plan with the patient. The patient was provided an opportunity to ask questions and all were answered. The patient agreed with the plan and demonstrated an understanding of the instructions.  A copy of instructions were sent to the patient via MyChart unless otherwise noted below.   The patient was advised to call back or seek an in-person evaluation if the symptoms worsen or if the condition fails to improve as anticipated.  Time:  I spent 10 minutes with the patient via telehealth technology discussing the above problems/concerns.    Piedad Climes, PA-C

## 2023-04-24 NOTE — Telephone Encounter (Signed)
Summary: cold symptoms   Patient called stated he is experiencing body ache, cold chills, coughing up greenish mucus, head congestion. There are no appts until next week. Please f/u with patient     Chief Complaint: Productive cough with green mucus, chills, body aches. No COVID test Symptoms: Above Frequency: Wednesday Pertinent Negatives: Patient denies SOB Disposition: [] ED /[] Urgent Care (no appt availability in office) / [] Appointment(In office/virtual)/ [x]  [facility]/ [] Home Care/ [] Refused Recommended Disposition /[] [facility]/ []  Follow-up with PCP Additional Notes: No availability in office, [facility] made.  Reason for Disposition  [1] Continuous (nonstop) coughing interferes with work or school AND [2] no improvement using cough treatment per Care Advice  Answer Assessment - Initial Assessment Questions 1. ONSET: "When did the cough begin?"      Wednesday 2. SEVERITY: "How bad is the cough today?"      Severe 3. SPUTUM: "Describe the color of your sputum" (none, dry cough; clear, white, yellow, green)     Green 4. HEMOPTYSIS: "Are you coughing up any blood?" If so ask: "How much?" (flecks, streaks, tablespoons, etc.)     No 5. DIFFICULTY BREATHING: "Are you having difficulty breathing?" If Yes, ask: "How bad is it?" (e.g., mild, moderate, severe)    - MILD: No SOB at rest, mild SOB with walking, speaks normally in sentences, can lie down, no retractions, pulse < 100.    - MODERATE: SOB at rest, SOB with minimal exertion and prefers to sit, cannot lie down flat, speaks in phrases, mild retractions, audible wheezing, pulse 100-120.    - SEVERE: Very SOB at rest, speaks in single words, struggling to breathe, sitting hunched forward, retractions, pulse > 120      No 6. FEVER: "Do you have a fever?" If Yes, ask: "What is your temperature, how was it measured, and when did it start?"     Chills 7. CARDIAC HISTORY: "Do you have any history of heart disease?"  (e.g., heart attack, congestive heart failure)      No 8. LUNG HISTORY: "Do you have any history of lung disease?"  (e.g., pulmonary embolus, asthma, emphysema)     No 9. PE RISK FACTORS: "Do you have a history of blood clots?" (or: recent major surgery, recent prolonged travel, bedridden)     No 10. OTHER SYMPTOMS: "Do you have any other symptoms?" (e.g., runny nose, wheezing, chest pain)       Headache, chills, body aches 11. PREGNANCY: "Is there any chance you are pregnant?" "When was your last menstrual period?"       N/a 12. TRAVEL: "Have you traveled out of the country in the last month?" (e.g., travel history, exposures)       No  Protocols used: Cough - Acute Productive-A-AH

## 2023-08-05 ENCOUNTER — Encounter: Payer: Self-pay | Admitting: Nurse Practitioner

## 2023-08-05 ENCOUNTER — Ambulatory Visit: Payer: BC Managed Care – PPO | Admitting: Nurse Practitioner

## 2023-08-05 ENCOUNTER — Other Ambulatory Visit: Payer: Self-pay

## 2023-08-05 VITALS — BP 132/74 | HR 85 | Temp 98.5°F | Resp 18 | Ht 66.0 in | Wt 176.8 lb

## 2023-08-05 DIAGNOSIS — H6502 Acute serous otitis media, left ear: Secondary | ICD-10-CM | POA: Diagnosis not present

## 2023-08-05 DIAGNOSIS — R051 Acute cough: Secondary | ICD-10-CM

## 2023-08-05 MED ORDER — AMOXICILLIN-POT CLAVULANATE 875-125 MG PO TABS: 1.0000 | ORAL_TABLET | Freq: Two times a day (BID) | ORAL | 0 refills | Status: AC

## 2023-08-05 MED ORDER — BENZONATATE 100 MG PO CAPS: 200.0000 mg | ORAL_CAPSULE | Freq: Two times a day (BID) | ORAL | 0 refills | Status: DC | PRN

## 2023-08-05 MED ORDER — PROMETHAZINE-DM 6.25-15 MG/5ML PO SYRP: 5.0000 mL | ORAL_SOLUTION | Freq: Four times a day (QID) | ORAL | 0 refills | Status: DC | PRN

## 2023-08-05 NOTE — Progress Notes (Signed)
BP 132/74   Pulse 85   Temp 98.5 F (36.9 C) (Oral)   Resp 18   Ht 5\' 6"  (1.676 m)   Wt 176 lb 12.8 oz (80.2 kg)   SpO2 98%   BMI 28.54 kg/m    Subjective:    Patient ID: Alan Mathews, male    DOB: 15-Sep-1970, 52 y.o.   MRN: [id number]  HPI: Alan Mathews is a 52 y.o. male  Chief Complaint  Patient presents with   URI    Runny nose, cough, for 3 days   Discussed the use of AI scribe software for clinical note transcription with the patient, who gave verbal consent to proceed.  History of Present Illness   The patient presents with a couple of days of upper respiratory symptoms, including a runny and stuffed nose, coughing, and ear pressure. He denies fever and shortness of breath. He has been self-treating with Mucinex, which he took at night instead of the recommended daytime use. He also has been using Flonase. He has not been around anyone else who is sick. He sought medical attention to prevent the symptoms from progressing to his chest. He denies any known allergies. He has been tested for COVID-19 and is awaiting the results.   Relevant past medical, surgical, family and social history reviewed and updated as indicated. Interim medical history since our last visit reviewed. Allergies and medications reviewed and updated.  Review of Systems  Ten systems reviewed and is negative except as mentioned in HPI       Objective:    BP 132/74   Pulse 85   Temp 98.5 F (36.9 C) (Oral)   Resp 18   Ht 5\' 6"  (1.676 m)   Wt 176 lb 12.8 oz (80.2 kg)   SpO2 98%   BMI 28.54 kg/m   Wt Readings from Last 3 Encounters:  08/05/23 176 lb 12.8 oz (80.2 kg)  04/03/23 170 lb 12.8 oz (77.5 kg)  11/14/22 175 lb 8 oz (79.6 kg)    Physical Exam  Constitutional: Patient appears well-developed and well-nourished.  No distress.  HEENT: head atraumatic, normocephalic, pupils equal and reactive to light, ears left TM red and bulging, neck supple, throat within normal limits,  lymphadenopathy Cardiovascular: Normal rate, regular rhythm and normal heart sounds.  No murmur heard. No BLE edema. Pulmonary/Chest: Effort normal and breath sounds normal. No respiratory distress. Abdominal: Soft.  There is no tenderness. Psychiatric: Patient has a normal mood and affect. behavior is normal. Judgment and thought content normal.  Results for orders placed or performed in visit on 10/02/22  CBC with Differential/Platelet  Result Value Ref Range   WBC 5.4 3.8 - 10.8 Thousand/uL   RBC 5.26 4.20 - 5.80 Million/uL   Hemoglobin 14.8 13.2 - 17.1 g/dL   HCT 42.5 95.6 - 38.7 %   MCV 83.8 80.0 - 100.0 fL   MCH 28.1 27.0 - 33.0 pg   MCHC 33.6 32.0 - 36.0 g/dL   RDW 56.4 33.2 - 95.1 %   Platelets 278 140 - 400 Thousand/uL   MPV 11.5 7.5 - 12.5 fL   Neutro Abs 3,380 1,500 - 7,800 cells/uL   Lymphs Abs 1,334 850 - 3,900 cells/uL   Absolute Monocytes 437 200 - 950 cells/uL   Eosinophils Absolute 211 15 - 500 cells/uL   Basophils Absolute 38 0 - 200 cells/uL   Neutrophils Relative % 62.6 %   Total Lymphocyte 24.7 %   Monocytes Relative 8.1 %  Eosinophils Relative 3.9 %   Basophils Relative 0.7 %  COMPLETE METABOLIC PANEL WITH GFR  Result Value Ref Range   Glucose, Bld 82 65 - 99 mg/dL   BUN 19 7 - 25 mg/dL   Creat 7.82 9.56 - 2.13 mg/dL   eGFR 85 > OR = 60 YQ/MVH/8.46N6   BUN/Creatinine Ratio SEE NOTE: 6 - 22 (calc)   Sodium 142 135 - 146 mmol/L   Potassium 4.4 3.5 - 5.3 mmol/L   Chloride 109 98 - 110 mmol/L   CO2 26 20 - 32 mmol/L   Calcium 9.1 8.6 - 10.3 mg/dL   Total Protein 6.5 6.1 - 8.1 g/dL   Albumin 4.1 3.6 - 5.1 g/dL   Globulin 2.4 1.9 - 3.7 g/dL (calc)   AG Ratio 1.7 1.0 - 2.5 (calc)   Total Bilirubin 0.3 0.2 - 1.2 mg/dL   Alkaline phosphatase (APISO) 80 35 - 144 U/L   AST 13 10 - 35 U/L   ALT 11 9 - 46 U/L  Lipid panel  Result Value Ref Range   Cholesterol 152 <200 mg/dL   HDL 56 > OR = 40 mg/dL   Triglycerides 67 <295 mg/dL   LDL Cholesterol (Calc)  82 mg/dL (calc)   Total CHOL/HDL Ratio 2.7 <5.0 (calc)   Non-HDL Cholesterol (Calc) 96 <284 mg/dL (calc)      Assessment & Plan:   Problem List Items Addressed This Visit   None Visit Diagnoses     Acute cough    -  Primary   Relevant Medications   promethazine-dextromethorphan (PROMETHAZINE-DM) 6.25-15 MG/5ML syrup   benzonatate (TESSALON) 100 MG capsule   Other Relevant Orders   Novel Coronavirus, NAA ([facility])   Non-recurrent acute serous otitis media of left ear       Relevant Medications   amoxicillin-clavulanate (AUGMENTIN) 875-125 MG tablet       Assessment and Plan    Upper Respiratory Infection with Otitis Media Symptoms of runny nose, cough, and ear pressure. Left ear is red on examination, indicating infection. No fever or shortness of breath. Patient has been taking Mucinex. COVID test performed with results pending. -Prescribe antibiotic to cover ear infection and potential upper respiratory infection. -Prescribe Tessalon Perles for cough. -Prescribe cough syrup, cautioning patient that it may cause drowsiness. -Advise patient to continue taking Mucinex, and consider adding Zyrtec or Claritin and Flonase. -If COVID test is positive, send in prescription for COVID treatment. -Advise patient to rest and stay hydrated.  General Health Maintenance -Check MyChart for COVID test results. -If positive, prescription for COVID treatment will be sent to [facility].         Follow up plan: Return if symptoms worsen or fail to improve.

## 2023-08-06 LAB — NOVEL CORONAVIRUS, NAA: SARS-CoV-2, NAA: NOT DETECTED

## 2023-10-05 ENCOUNTER — Ambulatory Visit (INDEPENDENT_AMBULATORY_CARE_PROVIDER_SITE_OTHER): Payer: BC Managed Care – PPO | Admitting: Nurse Practitioner

## 2023-10-05 ENCOUNTER — Encounter: Payer: Self-pay | Admitting: Nurse Practitioner

## 2023-10-05 VITALS — BP 134/80 | HR 64 | Temp 97.8°F | Resp 18 | Ht 66.0 in | Wt 176.8 lb

## 2023-10-05 DIAGNOSIS — Z13220 Encounter for screening for lipoid disorders: Secondary | ICD-10-CM

## 2023-10-05 DIAGNOSIS — Z130 Encounter for screening for diseases of the blood and blood-forming organs and certain disorders involving the immune mechanism: Secondary | ICD-10-CM | POA: Diagnosis not present

## 2023-10-05 DIAGNOSIS — Z113 Encounter for screening for infections with a predominantly sexual mode of transmission: Secondary | ICD-10-CM

## 2023-10-05 DIAGNOSIS — Z131 Encounter for screening for diabetes mellitus: Secondary | ICD-10-CM | POA: Diagnosis not present

## 2023-10-05 DIAGNOSIS — Z125 Encounter for screening for malignant neoplasm of prostate: Secondary | ICD-10-CM

## 2023-10-05 DIAGNOSIS — Z0000 Encounter for general adult medical examination without abnormal findings: Secondary | ICD-10-CM | POA: Diagnosis not present

## 2023-10-05 NOTE — Progress Notes (Signed)
Name: Alan Mathews   MRN: [id number]    DOB: 31-Mar-1971   Date:10/05/2023       Progress Note  Subjective  Chief Complaint  Chief Complaint  Patient presents with   Annual Exam    HPI  Patient presents for annual CPE .     IPSS     Row Name 10/05/23 1011         International Prostate Symptom Score   How often have you had the sensation of not emptying your bladder? Not at All     How often have you had to urinate less than every two hours? Not at All     How often have you found you stopped and started again several times when you urinated? Not at All     How often have you found it difficult to postpone urination? Not at All     How often have you had a weak urinary stream? Not at All     How often have you had to strain to start urination? Not at All     How many times did you typically get up at night to urinate? None     Total IPSS Score 0       Quality of Life due to urinary symptoms   If you were to spend the rest of your life with your urinary condition just the way it is now how would you feel about that? Delighted              Health Maintenance  Topic Date Due   COVID-19 Vaccine (3 - Pfizer risk series) 10/21/2023*   Flu Shot  12/07/2023*   Zoster (Shingles) Vaccine (1 of 2) 01/03/2024*   Colon Cancer Screening  11/30/2031   DTaP/Tdap/Td vaccine (4 - Td or Tdap) 04/02/2033   Hepatitis C Screening  Completed   HIV Screening  Completed   HPV Vaccine  Aged Out  *Topic was postponed. The date shown is not the original due date.     Diet: high protein diet Exercise: says none,  recommend 150 min of physical activity weekly   Sleep: 4 hours Last dental exam:08/2023 Last eye exam: has appointment next month  Depression: phq 9 is negative    10/05/2023   10:12 AM 08/05/2023    9:01 AM 04/03/2023    9:32 AM 11/14/2022    2:22 PM 10/02/2022    3:23 PM  Depression screen PHQ 2/9  Decreased Interest 0 0 0 0 0  Down, Depressed, Hopeless 0 0 0 0 0  PHQ -  2 Score 0 0 0 0 0  Altered sleeping   0    Tired, decreased energy   0    Change in appetite   0    Feeling bad or failure about yourself    0    Trouble concentrating   0    Moving slowly or fidgety/restless   0    Suicidal thoughts   0    PHQ-9 Score   0    Difficult doing work/chores   Not difficult at all      Hypertension:  BP Readings from Last 3 Encounters:  10/05/23 134/80  08/05/23 132/74  04/03/23 136/80    Obesity: Wt Readings from Last 3 Encounters:  10/05/23 176 lb 12.8 oz (80.2 kg)  08/05/23 176 lb 12.8 oz (80.2 kg)  04/03/23 170 lb 12.8 oz (77.5 kg)   BMI Readings from Last 3 Encounters:  10/05/23 28.54  kg/m  08/05/23 28.54 kg/m  04/03/23 27.57 kg/m     Lipids:  Lab Results  Component Value Date   CHOL 152 10/02/2022   CHOL 165 01/30/2022   CHOL 165 07/01/2021   Lab Results  Component Value Date   HDL 56 10/02/2022   HDL 66 01/30/2022   HDL 65 07/01/2021   Lab Results  Component Value Date   LDLCALC 82 10/02/2022   LDLCALC 84 01/30/2022   LDLCALC 86 07/01/2021   Lab Results  Component Value Date   TRIG 67 10/02/2022   TRIG 70 01/30/2022   TRIG 56 07/01/2021   Lab Results  Component Value Date   CHOLHDL 2.7 10/02/2022   CHOLHDL 2.5 01/30/2022   CHOLHDL 2.5 07/01/2021   No results found for: "LDLDIRECT" Glucose:  Glucose  Date Value Ref Range Status  08/09/2014 87 65 - 99 mg/dL Final  16/06/9603 72 65 - 99 mg/dL Final   Glucose, Bld  Date Value Ref Range Status  10/02/2022 82 65 - 99 mg/dL Final    Comment:    .            Fasting reference interval .   01/30/2022 88 65 - 99 mg/dL Final    Comment:    .            Fasting reference interval .   07/01/2021 89 65 - 99 mg/dL Final    Comment:    .            Fasting reference interval .     Flowsheet Row Office Visit from 11/14/2022 in [facility]  AUDIT-C Score 1        Single STD testing and prevention (HIV/chl/gon/syphilis):  completed Hep C: completed  Skin cancer: Discussed monitoring for atypical lesions Colorectal cancer: 11/29/2021 Prostate cancer: ordered Lab Results  Component Value Date   PSA 1.0 08/08/2016   PSA 1.23 08/06/2015   PSA 1.1 07/18/2014     Lung cancer:   Low Dose CT Chest recommended if Age 35-80 years, 30 pack-year currently smoking OR have quit w/in 15years. Patient does not qualify.   AAA:  The USPSTF recommends one-time screening with ultrasonography in men ages 70 to 72 years who have ever smoked ECG:  10/12/2017  Vaccines:  HPV: up to at age 53 , ask insurance if age between 57-45  Shingrix: 53-64 yo and ask insurance if covered when patient above 56 yo Pneumonia:  educated and discussed with patient. Flu:  educated and discussed with patient.  Advanced Care Planning: A voluntary discussion about advance care planning including the explanation and discussion of advance directives.  Discussed health care proxy and Living will, and the patient was able to identify a health care proxy as mom.  Patient does not have a living will at present time. If patient does have living will, I have requested they bring this to the clinic to be scanned in to their chart.  Patient Active Problem List   Diagnosis Date Noted   Chronic idiopathic constipation 01/30/2022   Overweight 07/26/2020   Hyperlipidemia 07/26/2020   Abnormal ECG 05/28/2018   Non-adherence to medical treatment 05/28/2018   Colon cancer screening 11/12/2017   Blood in the stool 11/12/2017   Preventative health care 11/12/2017   Vitamin D deficiency 06/09/2017   GERD (gastroesophageal reflux disease) 06/30/2016   Essential hypertension 04/03/2015   Allergic rhinitis 02/19/2015   Anxiety 02/19/2015   Lactose intolerance 02/19/2015   Obstructive  apnea 02/19/2015    Past Surgical History:  Procedure Laterality Date   COLONOSCOPY WITH PROPOFOL N/A 11/29/2021   Procedure: COLONOSCOPY WITH PROPOFOL;  Surgeon: Wyline Mood,  MD;  [facility]: [facility] ENDOSCOPY;  Service: Gastroenterology;  Laterality: N/A;   TONSILLECTOMY AND ADENOIDECTOMY  04/03/2010    Family History  Problem Relation Age of Onset   Hypertension Mother    Hypertension Father    Hypertension Brother    Aneurysm Maternal Grandmother     Social History   Socioeconomic History   Marital status: Single    Spouse name: Not on file   Number of children: Not on file   Years of education: Not on file   Highest education level: Not on file  Occupational History   Not on file  Tobacco Use   Smoking status: Never   Smokeless tobacco: Never  Vaping Use   Vaping status: Never Used  Substance and Sexual Activity   Alcohol use: No   Drug use: No   Sexual activity: Yes    Birth control/protection: None  Other Topics Concern   Not on file  Social History Narrative   Not on file   Social Drivers of Health   Financial Resource Strain: Low Risk  (10/05/2023)   Overall Financial Resource Strain (CARDIA)    Difficulty of Paying Living Expenses: Not hard at all  Food Insecurity: No Food Insecurity (10/05/2023)   Hunger Vital Sign    Worried About Running Out of Food in the Last Year: Never true    Ran Out of Food in the Last Year: Never true  Transportation Needs: No Transportation Needs (10/05/2023)   PRAPARE - Administrator, Civil Service (Medical): No    Lack of Transportation (Non-Medical): No  Physical Activity: Inactive (10/05/2023)   Exercise Vital Sign    Days of Exercise per Week: 0 days    Minutes of Exercise per Session: 0 min  Stress: No Stress Concern Present (10/05/2023)   [employer] of Occupational Health - Occupational Stress Questionnaire    Feeling of Stress : Not at all  Social Connections: Moderately Integrated (10/05/2023)   Social Connection and Isolation Panel [NHANES]    Frequency of Communication with Friends and Family: More than three times a week    Frequency of Social Gatherings with Friends and  Family: More than three times a week    Attends Religious Services: More than 4 times per year    Active Member of [employer] or Organizations: Yes    Attends Banker Meetings: More than 4 times per year    Marital Status: Never married  Intimate Partner Violence: Not At Risk (10/05/2023)   Humiliation, Afraid, Rape, and Kick questionnaire    Fear of Current or Ex-Partner: No    Emotionally Abused: No    Physically Abused: No    Sexually Abused: No     Current Outpatient Medications:    linaclotide (LINZESS) 290 MCG CAPS capsule, Take 1 capsule (290 mcg total) by mouth daily before breakfast., Disp: 90 capsule, Rfl: 1   lisinopril-hydrochlorothiazide (ZESTORETIC) 20-12.5 MG tablet, Take 1 tablet by mouth daily., Disp: 90 tablet, Rfl: 3  No Known Allergies   ROS  Constitutional: Negative for fever or weight change.  Respiratory: Negative for cough and shortness of breath.   Cardiovascular: Negative for chest pain or palpitations.  Gastrointestinal: Negative for abdominal pain, no bowel changes.  Musculoskeletal: Negative for gait problem or joint swelling.  Skin: Negative for rash.  Neurological: Negative for dizziness or headache.  No other specific complaints in a complete review of systems (except as listed in HPI above).    Objective  Vitals:   10/05/23 1006  BP: 134/80  Pulse: 64  Resp: 18  Temp: 97.8 F (36.6 C)  SpO2: 97%  Weight: 176 lb 12.8 oz (80.2 kg)  Height: 5\' 6"  (1.676 m)    Body mass index is 28.54 kg/m.  Physical Exam Vitals reviewed.  Constitutional:      Appearance: Normal appearance.  HENT:     Head: Normocephalic.     Right Ear: Tympanic membrane normal.     Left Ear: Tympanic membrane normal.     Nose: Nose normal.  Eyes:     Extraocular Movements: Extraocular movements intact.     Conjunctiva/sclera: Conjunctivae normal.     Pupils: Pupils are equal, round, and reactive to light.  Neck:     Thyroid: No thyroid mass,  thyromegaly or thyroid tenderness.  Cardiovascular:     Rate and Rhythm: Normal rate and regular rhythm.     Pulses: Normal pulses.     Heart sounds: Normal heart sounds.  Pulmonary:     Effort: Pulmonary effort is normal.     Breath sounds: Normal breath sounds.  Abdominal:     General: Bowel sounds are normal.     Palpations: Abdomen is soft.  Musculoskeletal:        General: Normal range of motion.     Cervical back: Normal range of motion and neck supple.     Right lower leg: No edema.     Left lower leg: No edema.  Skin:    General: Skin is warm and dry.     Capillary Refill: Capillary refill takes less than 2 seconds.  Neurological:     General: No focal deficit present.     Mental Status: He is alert and oriented to person, place, and time. Mental status is at baseline.  Psychiatric:        Mood and Affect: Mood normal.        Behavior: Behavior normal.        Thought Content: Thought content normal.        Judgment: Judgment normal.      Recent Results (from the past 2160 hours)  Novel Coronavirus, NAA ([facility])     Status: None   Collection Time: 08/05/23 12:00 AM   Specimen: Nasopharyngeal(NP) swabs in vial transport medium   Nasopharynge  Previous  Result Value Ref Range   SARS-CoV-2, NAA Not Detected Not Detected    Comment: This nucleic acid amplification test was developed and its performance characteristics determined by [employer]. Nucleic acid amplification tests include RT-PCR and TMA. This test has not been FDA cleared or approved. This test has been authorized by FDA under an Emergency Use Authorization (EUA). This test is only authorized for the duration of time the declaration that circumstances exist justifying the authorization of the emergency use of in vitro diagnostic tests for detection of SARS-CoV-2 virus and/or diagnosis of COVID-19 infection under section 564(b)(1) of the Act, 21 U.S.C. 147WGN-5(A) (1), unless the authorization  is terminated or revoked sooner. When diagnostic testing is negative, the possibility of a false negative result should be considered in the context of a patient's recent exposures and the presence of clinical signs and symptoms consistent with COVID-19. An individual without symptoms of COVID-19 and who is not shedding SARS-CoV-2 virus wo uld expect to have a negative (  not detected) result in this assay.      Fall Risk:    10/05/2023   10:06 AM 08/05/2023    9:01 AM 04/03/2023    9:32 AM 11/14/2022    2:21 PM 10/02/2022    3:23 PM  Fall Risk   Falls in the past year? 0 0 0 0 0  Number falls in past yr: 0 0 0 0 0  Injury with Fall? 0 0 0 0 0  Risk for fall due to :  No Fall Risks     Follow up Falls evaluation completed Falls prevention discussed   Falls evaluation completed      Functional Status Survey: Is the patient deaf or have difficulty hearing?: No Does the patient have difficulty seeing, even when wearing glasses/contacts?: No Does the patient have difficulty concentrating, remembering, or making decisions?: No Does the patient have difficulty walking or climbing stairs?: No Does the patient have difficulty dressing or bathing?: No Does the patient have difficulty doing errands alone such as visiting a doctor's office or shopping?: No    Assessment & Plan  1. Annual physical exam (Primary)  - CBC with Differential/Platelet - COMPLETE METABOLIC PANEL WITH GFR - Lipid panel - Hemoglobin A1c - PSA  2. Screening for cholesterol level  - Lipid panel  3. Screening for deficiency anemia  - CBC with Differential/Platelet  4. Screening for diabetes mellitus  - COMPLETE METABOLIC PANEL WITH GFR - Hemoglobin A1c  5. Screening for prostate cancer  - PSA  6. Screening examination for STD (sexually transmitted disease)  - RPR - Hepatitis C antibody - HIV Antibody (routine testing w rflx)    -Prostate cancer screening and PSA options (with potential  risks and benefits of testing vs not testing) were discussed along with recent recs/guidelines. -USPSTF grade A and B recommendations reviewed with patient; age-appropriate recommendations, preventive care, screening tests, etc discussed and encouraged; healthy living encouraged; see AVS for patient education given to patient -Discussed importance of 150 minutes of physical activity weekly, eat two servings of fish weekly, eat one serving of tree nuts ( cashews, pistachios, pecans, almonds.Marland Kitchen) every other day, eat 6 servings of fruit/vegetables daily and drink plenty of water and avoid sweet beverages.  -Reviewed Health Maintenance: yes

## 2023-10-06 ENCOUNTER — Encounter: Payer: Self-pay | Admitting: Nurse Practitioner

## 2023-10-06 LAB — CBC WITH DIFFERENTIAL/PLATELET
Absolute Lymphocytes: 1457 {cells}/uL (ref 850–3900)
Absolute Monocytes: 431 {cells}/uL (ref 200–950)
Basophils Absolute: 47 {cells}/uL (ref 0–200)
Basophils Relative: 0.8 %
Eosinophils Absolute: 271 {cells}/uL (ref 15–500)
Eosinophils Relative: 4.6 %
HCT: 45.4 % (ref 38.5–50.0)
Hemoglobin: 15 g/dL (ref 13.2–17.1)
MCH: 28.4 pg (ref 27.0–33.0)
MCHC: 33 g/dL (ref 32.0–36.0)
MCV: 85.8 fL (ref 80.0–100.0)
MPV: 11.4 fL (ref 7.5–12.5)
Monocytes Relative: 7.3 %
Neutro Abs: 3693 {cells}/uL (ref 1500–7800)
Neutrophils Relative %: 62.6 %
Platelets: 287 10*3/uL (ref 140–400)
RBC: 5.29 10*6/uL (ref 4.20–5.80)
RDW: 13.5 % (ref 11.0–15.0)
Total Lymphocyte: 24.7 %
WBC: 5.9 10*3/uL (ref 3.8–10.8)

## 2023-10-06 LAB — PSA: PSA: 1 ng/mL (ref <=4.00)

## 2023-10-06 LAB — COMPLETE METABOLIC PANEL WITH GFR
AG Ratio: 1.6 (calc) (ref 1.0–2.5)
ALT: 18 U/L (ref 9–46)
AST: 18 U/L (ref 10–35)
Albumin: 4.3 g/dL (ref 3.6–5.1)
Alkaline phosphatase (APISO): 75 U/L (ref 35–144)
BUN: 17 mg/dL (ref 7–25)
CO2: 27 mmol/L (ref 20–32)
Calcium: 9.4 mg/dL (ref 8.6–10.3)
Chloride: 106 mmol/L (ref 98–110)
Creat: 1.03 mg/dL (ref 0.70–1.30)
Globulin: 2.7 g/dL (ref 1.9–3.7)
Glucose, Bld: 90 mg/dL (ref 65–99)
Potassium: 4.4 mmol/L (ref 3.5–5.3)
Sodium: 141 mmol/L (ref 135–146)
Total Bilirubin: 0.3 mg/dL (ref 0.2–1.2)
Total Protein: 7 g/dL (ref 6.1–8.1)
eGFR: 87 mL/min/{1.73_m2} (ref >=60)

## 2023-10-06 LAB — LIPID PANEL
Cholesterol: 163 mg/dL (ref <200)
HDL: 64 mg/dL (ref >=40)
LDL Cholesterol (Calc): 83 mg/dL
Non-HDL Cholesterol (Calc): 99 mg/dL (ref <130)
Total CHOL/HDL Ratio: 2.5 (calc) (ref <5.0)
Triglycerides: 84 mg/dL (ref <150)

## 2023-10-06 LAB — HEPATITIS C ANTIBODY: Hepatitis C Ab: NONREACTIVE

## 2023-10-06 LAB — RPR: RPR Ser Ql: NONREACTIVE

## 2023-10-06 LAB — HEMOGLOBIN A1C
Hgb A1c MFr Bld: 5.8 %{Hb} — ABNORMAL HIGH (ref <5.7)
Mean Plasma Glucose: 120 mg/dL
eAG (mmol/L): 6.6 mmol/L

## 2023-10-06 LAB — HIV ANTIBODY (ROUTINE TESTING W REFLEX): HIV 1&2 Ab, 4th Generation: NONREACTIVE

## 2023-11-13 ENCOUNTER — Encounter: Payer: Self-pay | Admitting: Nurse Practitioner

## 2023-11-13 ENCOUNTER — Ambulatory Visit: Admitting: Nurse Practitioner

## 2023-11-13 VITALS — BP 140/84 | HR 87 | Resp 18 | Ht 66.0 in | Wt 175.7 lb

## 2023-11-13 DIAGNOSIS — R051 Acute cough: Secondary | ICD-10-CM | POA: Diagnosis not present

## 2023-11-13 LAB — POC COVID19/FLU A&B COMBO
Covid Antigen, POC: NEGATIVE
Influenza A Antigen, POC: NEGATIVE
Influenza B Antigen, POC: NEGATIVE

## 2023-11-13 MED ORDER — PROMETHAZINE-DM 6.25-15 MG/5ML PO SYRP: 5.0000 mL | ORAL_SOLUTION | Freq: Four times a day (QID) | ORAL | 0 refills | Status: AC | PRN

## 2023-11-13 MED ORDER — BENZONATATE 100 MG PO CAPS: 200.0000 mg | ORAL_CAPSULE | Freq: Two times a day (BID) | ORAL | 0 refills | Status: AC | PRN

## 2023-11-13 NOTE — Progress Notes (Signed)
 BP (!) 140/84   Pulse 87   Resp 18   Ht 5\' 6"  (1.676 m)   Wt 175 lb 11.2 oz (79.7 kg)   SpO2 94%   BMI 28.36 kg/m    Subjective:    Patient ID: Alan Mathews, male    DOB: 08-Oct-1970, 53 y.o.   MRN: [id number]  HPI: Alan Mathews is a 53 y.o. male  Chief Complaint  Patient presents with   URI    Cough, head congestion/pressure and chills, 2 days    Discussed the use of AI scribe software for clinical note transcription with the patient, who gave verbal consent to proceed.  History of Present Illness   He presents with a cough.  He has a cough that began last night and worsened overnight. No congestion or sore throat is present. He denies any known exposure to sick individuals.  He experienced chills, describing them as feeling cold, and a headache. He feels tired but is unsure of the reason. No congestion or sore throat.  He has taken cough drops.       10/05/2023   10:12 AM 08/05/2023    9:01 AM 04/03/2023    9:32 AM  Depression screen PHQ 2/9  Decreased Interest 0 0 0  Down, Depressed, Hopeless 0 0 0  PHQ - 2 Score 0 0 0  Altered sleeping   0  Tired, decreased energy   0  Change in appetite   0  Feeling bad or failure about yourself    0  Trouble concentrating   0  Moving slowly or fidgety/restless   0  Suicidal thoughts   0  PHQ-9 Score   0  Difficult doing work/chores   Not difficult at all    Relevant past medical, surgical, family and social history reviewed and updated as indicated. Interim medical history since our last visit reviewed. Allergies and medications reviewed and updated.  Review of Systems  Ten systems reviewed and is negative except as mentioned in HPI      Objective:    BP (!) 140/84   Pulse 87   Resp 18   Ht 5\' 6"  (1.676 m)   Wt 175 lb 11.2 oz (79.7 kg)   SpO2 94%   BMI 28.36 kg/m    Wt Readings from Last 3 Encounters:  11/13/23 175 lb 11.2 oz (79.7 kg)  10/05/23 176 lb 12.8 oz (80.2 kg)  08/05/23 176 lb 12.8 oz (80.2  kg)    Physical Exam Vitals reviewed.  Constitutional:      Appearance: Normal appearance.  HENT:     Head: Normocephalic.     Right Ear: Tympanic membrane normal.     Left Ear: Tympanic membrane normal.     Nose: Nose normal.     Right Sinus: No maxillary sinus tenderness or frontal sinus tenderness.     Left Sinus: No maxillary sinus tenderness or frontal sinus tenderness.     Mouth/Throat:     Mouth: Mucous membranes are moist.     Pharynx: Oropharynx is clear. No oropharyngeal exudate or posterior oropharyngeal erythema.  Eyes:     Extraocular Movements: Extraocular movements intact.     Conjunctiva/sclera: Conjunctivae normal.     Pupils: Pupils are equal, round, and reactive to light.  Cardiovascular:     Rate and Rhythm: Normal rate and regular rhythm.  Pulmonary:     Effort: Pulmonary effort is normal.     Breath sounds: Normal breath sounds.  Lymphadenopathy:  Cervical: No cervical adenopathy.  Skin:    General: Skin is warm and dry.  Neurological:     General: No focal deficit present.     Mental Status: He is alert and oriented to person, place, and time. Mental status is at baseline.  Psychiatric:        Mood and Affect: Mood normal.        Behavior: Behavior normal.        Thought Content: Thought content normal.        Judgment: Judgment normal.     Results for orders placed or performed in visit on 11/13/23  POC Covid19/Flu A&B Antigen   Collection Time: 11/13/23 10:18 AM  Result Value Ref Range   Influenza A Antigen, POC Negative Negative   Influenza B Antigen, POC Negative Negative   Covid Antigen, POC Negative Negative       Assessment & Plan:   Problem List Items Addressed This Visit   None Visit Diagnoses       Acute cough    -  Primary   Relevant Medications   promethazine-dextromethorphan (PROMETHAZINE-DM) 6.25-15 MG/5ML syrup   benzonatate (TESSALON) 100 MG capsule   Other Relevant Orders   POC Covid19/Flu A&B Antigen (Completed)         Assessment and Plan    Acute Cough New onset cough with chills and headache. No congestion. Negative for COVID and flu. Normal lung sounds on examination. -Prescribe Tessalon Perles and cough syrup. -Advise over-the-counter Flonase, Zyrtec or Claritin, and plain Mucinex. -Advise to avoid cold medicines that can raise heart rate and blood pressure. -Advise to push fluids and rest. -Check-in on Monday, 11/16/2023.  General Health Maintenance -If fever develops, advise to take Tylenol or ibuprofen.        Follow up plan: Return if symptoms worsen or fail to improve.

## 2023-11-13 NOTE — Patient Instructions (Signed)
 Push fluids  Take zyrtec/Claritin, Flonase and mucinex

## 2023-11-16 ENCOUNTER — Telehealth: Payer: Self-pay | Admitting: Nurse Practitioner

## 2023-11-16 NOTE — Telephone Encounter (Unsigned)
 Copied from CRM [id number]. Topic: General - Call Back - No Documentation >> Nov 16, 2023 11:14 AM Higinio Roger wrote: Reason for CRM: Patient is requesting a callback from Della Goo FNP to discuss how patient is feeling  Callback #: [phone]

## 2023-11-16 NOTE — Telephone Encounter (Signed)
 Pt was told to call and let know how he was feeling, he states just a little better still having cough.  Please advise?

## 2024-04-19 ENCOUNTER — Other Ambulatory Visit: Payer: Self-pay | Admitting: Family Medicine

## 2024-04-19 DIAGNOSIS — I10 Essential (primary) hypertension: Secondary | ICD-10-CM

## 2024-04-19 DIAGNOSIS — K5904 Chronic idiopathic constipation: Secondary | ICD-10-CM

## 2024-04-22 NOTE — Telephone Encounter (Signed)
 Requested Prescriptions  Pending Prescriptions Disp Refills   LINZESS  290 MCG CAPS capsule [Pharmacy Med Name: LINZESS  290 MCG CAPSULE] 90 capsule 1    Sig: TAKE 1 CAPSULE BY MOUTH DAILY BEFORE BREAKFAST.     Gastroenterology: Irritable Bowel Syndrome Failed - 04/22/2024 12:03 PM      Failed - Valid encounter within last 12 months    Recent Outpatient Visits           5 months ago Acute cough   [facility] Gareth Mliss FALCON, FNP       Future Appointments             In 5 months Gareth, Mliss FALCON, FNP [facility]             lisinopril -hydrochlorothiazide  (ZESTORETIC ) 20-12.5 MG tablet [Pharmacy Med Name: LISINOPRIL -HCTZ 20-12.5 MG TAB] 90 tablet 1    Sig: TAKE 1 TABLET BY MOUTH EVERY DAY     Cardiovascular:  ACEI + Diuretic Combos Failed - 04/22/2024 12:03 PM      Failed - Na in normal range and within 180 days    Sodium  Date Value Ref Range Status  10/05/2023 141 135 - 146 mmol/L Final  08/09/2014 139 136 - 145 mmol/L Final         Failed - K in normal range and within 180 days    Potassium  Date Value Ref Range Status  10/05/2023 4.4 3.5 - 5.3 mmol/L Final  08/09/2014 3.6 3.5 - 5.1 mmol/L Final         Failed - Cr in normal range and within 180 days    Creat  Date Value Ref Range Status  10/05/2023 1.03 0.70 - 1.30 mg/dL Final         Failed - eGFR is 30 or above and within 180 days    GFR, Est African American  Date Value Ref Range Status  08/23/2020 89 > OR = 60 mL/min/1.88m2 Final   GFR, Est Non African American  Date Value Ref Range Status  08/23/2020 77 > OR = 60 mL/min/1.36m2 Final   eGFR  Date Value Ref Range Status  10/05/2023 87 > OR = 60 mL/min/1.42m2 Final         Failed - Last BP in normal range    BP Readings from Last 1 Encounters:  11/13/23 (!) 140/84         Failed - Valid encounter within last 6 months    Recent Outpatient Visits           5 months ago Acute cough   [facility] Gareth Mliss FALCON, FNP       Future Appointments             In 5 months Gareth, Mliss FALCON, FNP [facility]            Passed - Patient is not pregnant

## 2024-06-24 NOTE — Progress Notes (Signed)
 [name]                                          MRN: [id number]   06/24/2024   The [facility] Quality Team Specialist reviewed this patient medical record for the purposes of chart review for care gap closure. The following were reviewed: chart review for care gap closure-controlling blood pressure.    [facility] Quality Team

## 2024-10-07 ENCOUNTER — Encounter: Payer: Self-pay | Admitting: Nurse Practitioner

## 2024-10-07 NOTE — Progress Notes (Unsigned)
 Name: Alan Mathews   MRN: [id number]    DOB: Dec 23, 1970   Date:10/07/2024       Progress Note  Subjective  Chief Complaint  No chief complaint on file.   HPI  Patient presents for annual CPE ***.    Diet: *** Exercise: *** Sleep: *** Last dental exam:*** Last eye exam: ***  Depression: phq 9 is {gen pos neg:315643}    10/05/2023   10:12 AM 08/05/2023    9:01 AM 04/03/2023    9:32 AM 11/14/2022    2:22 PM 10/02/2022    3:23 PM  Depression screen PHQ 2/9  Decreased Interest 0 0 0 0 0  Down, Depressed, Hopeless 0 0 0 0 0  PHQ - 2 Score 0 0 0 0 0  Altered sleeping   0    Tired, decreased energy   0    Change in appetite   0    Feeling bad or failure about yourself    0    Trouble concentrating   0    Moving slowly or fidgety/restless   0    Suicidal thoughts   0    PHQ-9 Score   0     Difficult doing work/chores   Not difficult at all       Data saved with a previous flowsheet row definition    Hypertension:  BP Readings from Last 3 Encounters:  11/13/23 (!) 140/84  10/05/23 134/80  08/05/23 132/74    Obesity: Wt Readings from Last 3 Encounters:  11/13/23 175 lb 11.2 oz (79.7 kg)  10/05/23 176 lb 12.8 oz (80.2 kg)  08/05/23 176 lb 12.8 oz (80.2 kg)   BMI Readings from Last 3 Encounters:  11/13/23 28.36 kg/m  10/05/23 28.54 kg/m  08/05/23 28.54 kg/m     Lipids:  Lab Results  Component Value Date   CHOL 163 10/05/2023   CHOL 152 10/02/2022   CHOL 165 01/30/2022   Lab Results  Component Value Date   HDL 64 10/05/2023   HDL 56 10/02/2022   HDL 66 01/30/2022   Lab Results  Component Value Date   LDLCALC 83 10/05/2023   LDLCALC 82 10/02/2022   LDLCALC 84 01/30/2022   Lab Results  Component Value Date   TRIG 84 10/05/2023   TRIG 67 10/02/2022   TRIG 70 01/30/2022   Lab Results  Component Value Date   CHOLHDL 2.5 10/05/2023   CHOLHDL 2.7 10/02/2022   CHOLHDL 2.5 01/30/2022   No results found for: LDLDIRECT Glucose:  Glucose  Date  Value Ref Range Status  08/09/2014 87 65 - 99 mg/dL Final  88/92/7985 72 65 - 99 mg/dL Final   Glucose, Bld  Date Value Ref Range Status  10/05/2023 90 65 - 99 mg/dL Final    Comment:    .            Fasting reference interval .   10/02/2022 82 65 - 99 mg/dL Final    Comment:    .            Fasting reference interval .   01/30/2022 88 65 - 99 mg/dL Final    Comment:    .            Fasting reference interval .     Flowsheet Row [facility] Visit from [facility]/14/2022 in [facility]  AUDIT-C Score 1   ***  Single STD testing and prevention (HIV/chl/gon/syphilis): 10/05/2023   Hep C: 10/05/2023  Skin cancer: Discussed monitoring for atypical lesions Colorectal cancer: complete 11/29/2021 Prostate cancer:  Lab Results  Component Value Date   PSA 1.00 10/05/2023   PSA 1.0 08/08/2016   PSA 1.23 08/06/2015     Lung cancer:  *** Low Dose CT Chest recommended if Age 36-80 years, 30 pack-year currently smoking OR have quit w/in 15years. Patient {DOES NOT does:27190::does not} qualify.   AAA: *** The USPSTF recommends one-time screening with ultrasonography in men ages 75 to 31 years who have ever smoked ECG:  ***  Vaccines:  HPV: up to at age 42 , ask insurance if age between 68-45  Shingrix: 38-64 yo and ask insurance if covered when patient above 88 yo Pneumonia: *** educated and discussed with patient. Flu: *** educated and discussed with patient.  Advanced Care Planning: A voluntary discussion about advance care planning including the explanation and discussion of advance directives.  Discussed health care proxy and Living will, and the patient was able to identify a health care proxy as ***.  Patient {DOES_DOES WNU:81435} have a living will at present time. If patient does have living will, I have requested they bring this to the clinic to be scanned in to their chart.  Patient Active Problem List   Diagnosis Date Noted   Chronic idiopathic  constipation 01/30/2022   Overweight 07/26/2020   Hyperlipidemia 07/26/2020   Abnormal ECG 05/28/2018   Non-adherence to medical treatment 05/28/2018   Colon cancer screening 11/12/2017   Blood in the stool 11/12/2017   Preventative health care 11/12/2017   Vitamin D  deficiency 06/09/2017   GERD (gastroesophageal reflux disease) 06/30/2016   Essential hypertension 04/03/2015   Allergic rhinitis 02/19/2015   Anxiety 02/19/2015   Lactose intolerance 02/19/2015   Obstructive apnea 02/19/2015    Past Surgical History:  Procedure Laterality Date   COLONOSCOPY WITH PROPOFOL  N/A 11/29/2021   Procedure: COLONOSCOPY WITH PROPOFOL ;  Surgeon: Therisa Bi, MD;  Location: [facility] ENDOSCOPY;  Service: Gastroenterology;  Laterality: N/A;   TONSILLECTOMY AND ADENOIDECTOMY  04/03/2010    Family History  Problem Relation Age of Onset   Hypertension Mother    Hypertension Father    Hypertension Brother    Aneurysm Maternal Grandmother     Social History   Socioeconomic History   Marital status: Single    Spouse name: Not on file   Number of children: Not on file   Years of education: Not on file   Highest education level: Not on file  Occupational History   Not on file  Tobacco Use   Smoking status: Never   Smokeless tobacco: Never  Vaping Use   Vaping status: Never Used  Substance and Sexual Activity   Alcohol use: No   Drug use: No   Sexual activity: Yes    Birth control/protection: None  Other Topics Concern   Not on file  Social History Narrative   Not on file   Social Drivers of Health   Tobacco Use: Low Risk (11/13/2023)   Patient History    Smoking Tobacco Use: Never    Smokeless Tobacco Use: Never    Passive Exposure: Not on file  Financial Resource Strain: Low Risk (10/05/2023)   Overall Financial Resource Strain (CARDIA)    Difficulty of Paying Living Expenses: Not hard at all  Food Insecurity: No Food Insecurity (10/05/2023)   Hunger Vital Sign    Worried About  Running Out of Food in the Last Year: Never true    Ran Out of Food in  the Last Year: Never true  Transportation Needs: No Transportation Needs (10/05/2023)   PRAPARE - Administrator, Civil Service (Medical): No    Lack of Transportation (Non-Medical): No  Physical Activity: Inactive (10/05/2023)   Exercise Vital Sign    Days of Exercise per Week: 0 days    Minutes of Exercise per Session: 0 min  Stress: No Stress Concern Present (10/05/2023)   [employer] of Occupational Health - Occupational Stress Questionnaire    Feeling of Stress : Not at all  Social Connections: Moderately Integrated (10/05/2023)   Social Connection and Isolation Panel    Frequency of Communication with Friends and Family: More than three times a week    Frequency of Social Gatherings with Friends and Family: More than three times a week    Attends Religious Services: More than 4 times per year    Active Member of [employer] or Organizations: Yes    Attends Banker Meetings: More than 4 times per year    Marital Status: Never married  Intimate Partner Violence: Not At Risk (10/05/2023)   Humiliation, Afraid, Rape, and Kick questionnaire    Fear of Current or Ex-Partner: No    Emotionally Abused: No    Physically Abused: No    Sexually Abused: No  Depression (PHQ2-9): Low Risk (10/05/2023)   Depression (PHQ2-9)    PHQ-2 Score: 0  Alcohol Screen: Low Risk (11/14/2022)   Alcohol Screen    Last Alcohol Screening Score (AUDIT): 1  Housing: Low Risk (10/05/2023)   Housing Stability Vital Sign    Unable to Pay for Housing in the Last Year: No    Number of Times Moved in the Last Year: 0    Homeless in the Last Year: No  Utilities: Not At Risk (10/05/2023)   AHC Utilities    Threatened with loss of utilities: No  Health Literacy: Adequate Health Literacy (10/05/2023)   B1300 Health Literacy    Frequency of need for help with medical instructions: Never    Current  Medications[1]  Allergies[2]   ROS  ***   Objective  There were no vitals filed for this visit.  There is no height or weight on file to calculate BMI.  Physical Exam ***  No results found for this or any previous visit (from the past 2160 hours).   Fall Risk:    10/05/2023   10:06 AM 08/05/2023    9:01 AM 04/03/2023    9:32 AM 11/14/2022    2:21 PM 10/02/2022    3:23 PM  Fall Risk   Falls in the past year? 0 0 0 0 0  Number falls in past yr: 0 0 0 0 0  Injury with Fall? 0  0  0  0  0   Risk for fall due to :  No Fall Risks     Follow up Falls evaluation completed Falls prevention discussed   Falls evaluation completed     Data saved with a previous flowsheet row definition   ***   Functional Status Survey:   ***   Assessment & Plan  There are no diagnoses linked to this encounter.   -Prostate cancer screening and PSA options (with potential risks and benefits of testing vs not testing) were discussed along with recent recs/guidelines. -USPSTF grade A and B recommendations reviewed with patient; age-appropriate recommendations, preventive care, screening tests, etc discussed and encouraged; healthy living encouraged; see AVS for patient education given to patient -Discussed importance of  150 minutes of physical activity weekly, eat two servings of fish weekly, eat one serving of tree nuts ( cashews, pistachios, pecans, almonds.SABRA) every other day, eat 6 servings of fruit/vegetables daily and drink plenty of water and avoid sweet beverages.  -Reviewed Health Maintenance: ***     [1]  Current Outpatient Medications:    benzonatate  (TESSALON ) 100 MG capsule, Take 2 capsules (200 mg total) by mouth 2 (two) times daily as needed for cough., Disp: 20 capsule, Rfl: 0   LINZESS  290 MCG CAPS capsule, TAKE 1 CAPSULE BY MOUTH DAILY BEFORE BREAKFAST., Disp: 90 capsule, Rfl: 1   lisinopril -hydrochlorothiazide  (ZESTORETIC ) 20-12.5 MG tablet, TAKE 1 TABLET BY MOUTH EVERY  DAY, Disp: 90 tablet, Rfl: 1   promethazine -dextromethorphan (PROMETHAZINE -DM) 6.25-15 MG/5ML syrup, Take 5 mLs by mouth 4 (four) times daily as needed for cough., Disp: 118 mL, Rfl: 0 [2] No Known Allergies

## 2024-10-14 ENCOUNTER — Ambulatory Visit: Admitting: Nurse Practitioner

## 2024-10-14 ENCOUNTER — Encounter: Payer: Self-pay | Admitting: Nurse Practitioner

## 2024-10-14 VITALS — BP 136/92 | HR 60 | Temp 97.4°F | Ht 66.0 in | Wt 181.0 lb

## 2024-10-14 DIAGNOSIS — Z0000 Encounter for general adult medical examination without abnormal findings: Secondary | ICD-10-CM

## 2024-10-14 DIAGNOSIS — Z125 Encounter for screening for malignant neoplasm of prostate: Secondary | ICD-10-CM

## 2024-10-14 DIAGNOSIS — I10 Essential (primary) hypertension: Secondary | ICD-10-CM

## 2024-10-14 DIAGNOSIS — E785 Hyperlipidemia, unspecified: Secondary | ICD-10-CM

## 2024-10-14 DIAGNOSIS — Z13220 Encounter for screening for lipoid disorders: Secondary | ICD-10-CM

## 2024-10-14 DIAGNOSIS — R7303 Prediabetes: Secondary | ICD-10-CM

## 2024-10-14 LAB — COMPREHENSIVE METABOLIC PANEL WITH GFR
AG Ratio: 2 (calc) (ref 1.0–2.5)
ALT: 20 U/L (ref 9–46)
AST: 16 U/L (ref 10–35)
Albumin: 4.3 g/dL (ref 3.6–5.1)
Alkaline phosphatase (APISO): 82 U/L (ref 35–144)
BUN: 19 mg/dL (ref 7–25)
CO2: 24 mmol/L (ref 20–32)
Calcium: 9.2 mg/dL (ref 8.6–10.3)
Chloride: 109 mmol/L (ref 98–110)
Creat: 1.02 mg/dL (ref 0.70–1.30)
Globulin: 2.2 g/dL (ref 1.9–3.7)
Glucose, Bld: 88 mg/dL (ref 65–99)
Potassium: 4.3 mmol/L (ref 3.5–5.3)
Sodium: 140 mmol/L (ref 135–146)
Total Bilirubin: 0.3 mg/dL (ref 0.2–1.2)
Total Protein: 6.5 g/dL (ref 6.1–8.1)
eGFR: 87 mL/min/{1.73_m2}

## 2024-10-14 LAB — CBC WITH DIFFERENTIAL/PLATELET
Absolute Lymphocytes: 1198 {cells}/uL (ref 850–3900)
Absolute Monocytes: 510 {cells}/uL (ref 200–950)
Basophils Absolute: 50 {cells}/uL (ref 0–200)
Basophils Relative: 0.9 %
Eosinophils Absolute: 151 {cells}/uL (ref 15–500)
Eosinophils Relative: 2.7 %
HCT: 45.3 % (ref 39.4–51.1)
Hemoglobin: 15.4 g/dL (ref 13.2–17.1)
MCH: 29.1 pg (ref 27.0–33.0)
MCHC: 34 g/dL (ref 31.6–35.4)
MCV: 85.5 fL (ref 81.4–101.7)
MPV: 11.8 fL (ref 7.5–12.5)
Monocytes Relative: 9.1 %
Neutro Abs: 3690 {cells}/uL (ref 1500–7800)
Neutrophils Relative %: 65.9 %
Platelets: 243 10*3/uL (ref 140–400)
RBC: 5.3 Million/uL (ref 4.20–5.80)
RDW: 13.8 % (ref 11.0–15.0)
Total Lymphocyte: 21.4 %
WBC: 5.6 10*3/uL (ref 3.8–10.8)

## 2024-10-14 NOTE — Progress Notes (Signed)
 Name: Alan Mathews   MRN: [id number]    DOB: 1971/04/09   Date:10/14/2024       Progress Note  Subjective  Chief Complaint  Annual exam   HPI  Patient presents for annual CPE .  IPSS     Row Name 10/14/24 0904         International Prostate Symptom Score   How often have you had the sensation of not emptying your bladder? Not at All     How often have you had to urinate less than every two hours? Not at All     How often have you found you stopped and started again several times when you urinated? Not at All     How often have you found it difficult to postpone urination? Not at All     How often have you had a weak urinary stream? Not at All     How often have you had to strain to start urination? Not at All     How many times did you typically get up at night to urinate? None     Total IPSS Score 0       Quality of Life due to urinary symptoms   If you were to spend the rest of your life with your urinary condition just the way it is now how would you feel about that? Delighted         Diet: regular diet, protein enriched Exercise: walks a lot at work;works 4days, 10hr shifts Sleep: 4hr Last dental exam:January 2026 Last eye exam: next week   Depression: phq 9 is negative    10/14/2024    8:45 AM 10/05/2023   10:12 AM 08/05/2023    9:01 AM 04/03/2023    9:32 AM 11/14/2022    2:22 PM  Depression screen PHQ 2/9  Decreased Interest 0 0 0 0 0  Down, Depressed, Hopeless 0 0 0 0 0  PHQ - 2 Score 0 0 0 0 0  Altered sleeping    0   Tired, decreased energy    0   Change in appetite    0   Feeling bad or failure about yourself     0   Trouble concentrating    0   Moving slowly or fidgety/restless    0   Suicidal thoughts    0   PHQ-9 Score    0    Difficult doing work/chores    Not difficult at all      Data saved with a previous flowsheet row definition    Hypertension:  BP Readings from Last 3 Encounters:  10/14/24 (!) 136/92  11/13/23 (!) 140/84  10/05/23 134/80     Obesity: Wt Readings from Last 3 Encounters:  10/14/24 181 lb (82.1 kg)  11/13/23 175 lb 11.2 oz (79.7 kg)  10/05/23 176 lb 12.8 oz (80.2 kg)   BMI Readings from Last 3 Encounters:  10/14/24 29.21 kg/m  11/13/23 28.36 kg/m  10/05/23 28.54 kg/m     Lipids:  Lab Results  Component Value Date   CHOL 163 10/05/2023   CHOL 152 10/02/2022   CHOL 165 01/30/2022   Lab Results  Component Value Date   HDL 64 10/05/2023   HDL 56 10/02/2022   HDL 66 01/30/2022   Lab Results  Component Value Date   LDLCALC 83 10/05/2023   LDLCALC 82 10/02/2022   LDLCALC 84 01/30/2022   Lab Results  Component Value Date   TRIG  84 10/05/2023   TRIG 67 10/02/2022   TRIG 70 01/30/2022   Lab Results  Component Value Date   CHOLHDL 2.5 10/05/2023   CHOLHDL 2.7 10/02/2022   CHOLHDL 2.5 01/30/2022   No results found for: LDLDIRECT Glucose:  Glucose  Date Value Ref Range Status  08/09/2014 87 65 - 99 mg/dL Final  88/92/7985 72 65 - 99 mg/dL Final   Glucose, Bld  Date Value Ref Range Status  10/05/2023 90 65 - 99 mg/dL Final    Comment:    .            Fasting reference interval .   10/02/2022 82 65 - 99 mg/dL Final    Comment:    .            Fasting reference interval .   01/30/2022 88 65 - 99 mg/dL Final    Comment:    .            Fasting reference interval .     Flowsheet Row Office Visit from 10/14/2024 in [facility]  AUDIT-C Score 0     Single STD testing and prevention (HIV/chl/gon/syphilis): 10/05/2023 Hep C: 10/05/2023  Skin cancer: Discussed monitoring for atypical lesions Colorectal cancer: 11/2021 Prostate cancer: 10/05/2023 Lab Results  Component Value Date   PSA 1.00 10/05/2023   PSA 1.0 08/08/2016   PSA 1.23 08/06/2015     Lung cancer:   Low Dose CT Chest recommended if Age 62-80 years, 30 pack-year currently smoking OR have quit w/in 15years. Patient does not qualify.   AAA: The USPSTF recommends one-time screening  with ultrasonography in men ages 70 to 67 years who have ever smoked ECG:  10/2017  Vaccines:  HPV: up to at age 35 , ask insurance if age between 17-45  Shingrix: 55-64 yo and ask insurance if covered when patient above 61 yo Pneumonia: educated and discussed with patient. Flu: educated and discussed with patient.  Advanced Care Planning: A voluntary discussion about advance care planning including the explanation and discussion of advance directives.  Discussed health care proxy and Living will, and the patient was able to identify a health care proxy as Mom.  Patient does not have a living will at present time. If patient does have living will, I have requested they bring this to the clinic to be scanned in to their chart.  Patient Active Problem List   Diagnosis Date Noted   Chronic idiopathic constipation 01/30/2022   Overweight 07/26/2020   Hyperlipidemia 07/26/2020   Abnormal ECG 05/28/2018   Non-adherence to medical treatment 05/28/2018   Colon cancer screening 11/12/2017   Blood in the stool 11/12/2017   Preventative health care 11/12/2017   Vitamin D  deficiency 06/09/2017   GERD (gastroesophageal reflux disease) 06/30/2016   Essential hypertension 04/03/2015   Allergic rhinitis 02/19/2015   Anxiety 02/19/2015   Lactose intolerance 02/19/2015   Obstructive apnea 02/19/2015    Past Surgical History:  Procedure Laterality Date   COLONOSCOPY WITH PROPOFOL  N/A 11/29/2021   Procedure: COLONOSCOPY WITH PROPOFOL ;  Surgeon: Therisa Bi, MD;  Location: [facility] END[facility]COPY;  Service: Gastroenterology;  Laterality: N/A;   TONSILLECTOMY AND ADENOIDECTOMY  04/03/2010    Family History  Problem Relation Age of Onset   Hypertension Mother    Hypertension Father    Hypertension Brother    Aneurysm Maternal Grandmother     Social History   Socioeconomic History   Marital status: Single    Spouse name: Not  on file   Number of children: Not on file   Years of education: Not on file    Highest education level: Not on file  Occupational History   Not on file  Tobacco Use   Smoking status: Never   Smokeless tobacco: Never  Vaping Use   Vaping status: Never Used  Substance and Sexual Activity   Alcohol use: No   Drug use: No   Sexual activity: Yes    Birth control/protection: None  Other Topics Concern   Not on file  Social History Narrative   Not on file   Social Drivers of Health   Tobacco Use: Low Risk (10/14/2024)   Patient History    Smoking Tobacco Use: Never    Smokeless Tobacco Use: Never    Passive Exposure: Not on file  Financial Resource Strain: Low Risk (10/14/2024)   Overall Financial Resource Strain (CARDIA)    Difficulty of Paying Living Expenses: Not hard at all  Food Insecurity: No Food Insecurity (10/14/2024)   Epic    Worried About Radiation Protection Practitioner of Food in the Last Year: Never true    Ran Out of Food in the Last Year: Never true  Transportation Needs: No Transportation Needs (10/14/2024)   Epic    Lack of Transportation (Medical): No    Lack of Transportation (Non-Medical): No  Physical Activity: Insufficiently Active (10/14/2024)   Exercise Vital Sign    Days of Exercise per Week: 2 days    Minutes of Exercise per Session: 30 min  Stress: No Stress Concern Present (10/14/2024)   [employer] of Occupational Health - Occupational Stress Questionnaire    Feeling of Stress: Not at all  Social Connections: Moderately Isolated (10/14/2024)   Social Connection and Isolation Panel    Frequency of Communication with Friends and Family: More than three times a week    Frequency of Social Gatherings with Friends and Family: More than three times a week    Attends Religious Services: 1 to 4 times per year    Active Member of [employer] or Organizations: No    Attends Banker Meetings: Never    Marital Status: Never married  Intimate Partner Violence: Not At Risk (10/14/2024)   Epic    Fear of Current or Ex-Partner: No    Emotionally Abused:  No    Physically Abused: No    Sexually Abused: No  Depression (PHQ2-9): Low Risk (10/14/2024)   Depression (PHQ2-9)    PHQ-2 Score: 0  Alcohol Screen: Low Risk (10/14/2024)   Alcohol Screen    Last Alcohol Screening Score (AUDIT): 0  Housing: Unknown (10/14/2024)   Epic    Unable to Pay for Housing in the Last Year: No    Number of Times Moved in the Last Year: Not on file    Homeless in the Last Year: No  Utilities: Not At Risk (10/14/2024)   Epic    Threatened with loss of utilities: No  Health Literacy: Adequate Health Literacy (10/14/2024)   B1300 Health Literacy    Frequency of need for help with medical instructions: Never    Current Medications[1]  Allergies[2]   Review of Systems  All other systems reviewed and are negative.  Constitutional: Negative for fever or weight change.  Respiratory: Negative for cough and shortness of breath.   Cardiovascular: Negative for chest pain or palpitations.  Gastrointestinal: Negative for abdominal pain, no bowel changes.  Musculoskeletal: Negative for gait problem or joint swelling.  Skin: Negative for rash.  Neurological: Negative for dizziness or headache.  No other specific complaints in a complete review of systems (except as listed in HPI above).     Objective  Vitals:   10/14/24 0840 10/14/24 0913  BP: (!) 146/104 (!) 136/92  Pulse: 60   Temp: (!) 97.4 F (36.3 C)   SpO2: 95%   Weight: 181 lb (82.1 kg)   Height: 5' 6 (1.676 m)     Body mass index is 29.21 kg/m.  Physical Exam Vitals reviewed.  Constitutional:      Appearance: Normal appearance.  HENT:     Head: Normocephalic.     Right Ear: Tympanic membrane normal.     Left Ear: Tympanic membrane normal.     Mouth/Throat:     Mouth: Mucous membranes are moist.  Eyes:     Pupils: Pupils are equal, round, and reactive to light.  Cardiovascular:     Rate and Rhythm: Normal rate and regular rhythm.  Pulmonary:     Effort: Pulmonary effort is normal.      Breath sounds: Normal breath sounds.  Abdominal:     General: Abdomen is flat. Bowel sounds are normal.  Musculoskeletal:        General: Normal range of motion.     Cervical back: Normal range of motion.  Skin:    General: Skin is warm and dry.  Neurological:     Mental Status: He is alert.      No results found for this or any previous visit (from the past 2160 hours).   Fall Risk:    10/14/2024    8:41 AM 10/05/2023   10:06 AM 08/05/2023    9:01 AM 04/03/2023    9:32 AM 11/14/2022    2:21 PM  Fall Risk   Falls in the past year? 0 0 0 0 0  Number falls in past yr: 0 0 0 0 0  Injury with Fall? 0 0  0  0  0   Risk for fall due to : No Fall Risks  No Fall Risks    Follow up Falls evaluation completed Falls evaluation completed Falls prevention discussed       Data saved with a previous flowsheet row definition      Functional Status Survey: Is the patient deaf or have difficulty hearing?: No Does the patient have difficulty seeing, even when wearing glasses/contacts?: No Does the patient have difficulty concentrating, remembering, or making decisions?: No Does the patient have difficulty walking or climbing stairs?: No Does the patient have difficulty dressing or bathing?: No Does the patient have difficulty doing errands alone such as visiting a doctor's office or shopping?: No    Assessment & Plan  Problem List Items Addressed This Visit       Cardiovascular and Mediastinum   Essential hypertension   Relevant Orders   CBC with Differential/Platelet   Comprehensive Metabolic Panel (CMET)     Other   Hyperlipidemia   Relevant Orders   Lipid Profile   Other Visit Diagnoses       Annual physical exam    -  Primary   routine labs were ordered   Relevant Orders   CBC with Differential/Platelet   Lipid Profile   HgB A1c   Comprehensive Metabolic Panel (CMET)     Prostate cancer screening       Relevant Orders   PSA     Prediabetes       Relevant Orders    HgB A1c  Comprehensive Metabolic Panel (CMET)     Screening for cholesterol level       Relevant Orders   Lipid Profile         -Prostate cancer screening and PSA options (with potential risks and benefits of testing vs not testing) were discussed along with recent recs/guidelines. -USPSTF grade A and B recommendations reviewed with patient; age-appropriate recommendations, preventive care, screening tests, etc discussed and encouraged; healthy living encouraged; see AVS for patient education given to patient -Discussed importance of 150 minutes of physical activity weekly, eat two servings of fish weekly, eat one serving of tree nuts ( cashews, pistachios, pecans, almonds.SABRA) every other day, eat 6 servings of fruit/vegetables daily and drink plenty of water and avoid sweet beverages.  -Reviewed Health Maintenance: done  I have reviewed this encounter including the documentation in this note and/or discussed this patient with the provider, Diamone Mays SNP, I am certifying that I agree with the content of this note as supervising/preceptor nurse practitioner.  Mliss Spray, FNP-C [facility] 10/14/2024, 11:17 AM       [1]  Current Outpatient Medications:    benzonatate  (TESSALON ) 100 MG capsule, Take 2 capsules (200 mg total) by mouth 2 (two) times daily as needed for cough., Disp: 20 capsule, Rfl: 0   LINZESS  290 MCG CAPS capsule, TAKE 1 CAPSULE BY MOUTH DAILY BEFORE BREAKFAST., Disp: 90 capsule, Rfl: 1   lisinopril -hydrochlorothiazide  (ZESTORETIC ) 20-12.5 MG tablet, TAKE 1 TABLET BY MOUTH EVERY DAY, Disp: 90 tablet, Rfl: 1   promethazine -dextromethorphan (PROMETHAZINE -DM) 6.25-15 MG/5ML syrup, Take 5 mLs by mouth 4 (four) times daily as needed for cough., Disp: 118 mL, Rfl: 0 [2] No Known Allergies
